# Patient Record
Sex: Female | Born: 1995 | Race: Black or African American | Hispanic: No | Marital: Single | Smoking: Never smoker
Health system: Southern US, Community
[De-identification: ages and names within clinical notes are randomized; demographics above are authoritative.]

## PROBLEM LIST (undated history)

## (undated) DIAGNOSIS — R569 Unspecified convulsions: Secondary | ICD-10-CM

## (undated) DIAGNOSIS — N760 Acute vaginitis: Secondary | ICD-10-CM

## (undated) DIAGNOSIS — R0602 Shortness of breath: Secondary | ICD-10-CM

## (undated) DIAGNOSIS — D649 Anemia, unspecified: Secondary | ICD-10-CM

## (undated) DIAGNOSIS — N83209 Unspecified ovarian cyst, unspecified side: Secondary | ICD-10-CM

## (undated) DIAGNOSIS — I10 Essential (primary) hypertension: Secondary | ICD-10-CM

## (undated) DIAGNOSIS — J45909 Unspecified asthma, uncomplicated: Secondary | ICD-10-CM

## (undated) DIAGNOSIS — R51 Headache: Secondary | ICD-10-CM

## (undated) DIAGNOSIS — F419 Anxiety disorder, unspecified: Secondary | ICD-10-CM

## (undated) DIAGNOSIS — Z3490 Encounter for supervision of normal pregnancy, unspecified, unspecified trimester: Secondary | ICD-10-CM

## (undated) DIAGNOSIS — B999 Unspecified infectious disease: Secondary | ICD-10-CM

## (undated) HISTORY — DX: Essential (primary) hypertension: I10

## (undated) HISTORY — DX: Encounter for supervision of normal pregnancy, unspecified, unspecified trimester: Z34.90

---
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
## 2014-01-20 ENCOUNTER — Inpatient Hospital Stay (HOSPITAL_COMMUNITY)
Admission: AD | Admit: 2014-01-20 | Discharge: 2014-01-20 | Disposition: A | Discharge status: Home / Self Care | Payer: BC Managed Care – PPO | Source: Ambulatory Visit | Attending: Obstetrics & Gynecology | Admitting: Obstetrics & Gynecology

## 2014-01-20 ENCOUNTER — Inpatient Hospital Stay (HOSPITAL_COMMUNITY): Payer: BC Managed Care – PPO

## 2014-01-20 ENCOUNTER — Encounter (HOSPITAL_COMMUNITY): Payer: Self-pay | Admitting: *Deleted

## 2014-01-20 DIAGNOSIS — N760 Acute vaginitis: Secondary | ICD-10-CM

## 2014-01-20 DIAGNOSIS — F17210 Nicotine dependence, cigarettes, uncomplicated: Secondary | ICD-10-CM | POA: Diagnosis not present

## 2014-01-20 DIAGNOSIS — R1032 Left lower quadrant pain: Secondary | ICD-10-CM | POA: Insufficient documentation

## 2014-01-20 DIAGNOSIS — B9689 Other specified bacterial agents as the cause of diseases classified elsewhere: Secondary | ICD-10-CM

## 2014-01-20 DIAGNOSIS — A499 Bacterial infection, unspecified: Secondary | ICD-10-CM

## 2014-01-20 LAB — URINALYSIS, ROUTINE W REFLEX MICROSCOPIC
Bilirubin Urine: NEGATIVE
GLUCOSE, UA: NEGATIVE mg/dL
KETONES UR: NEGATIVE mg/dL
Nitrite: NEGATIVE
PH: 6 (ref 5.0–8.0)
Protein, ur: NEGATIVE mg/dL
Specific Gravity, Urine: 1.02 (ref 1.005–1.030)
Urobilinogen, UA: 1 mg/dL (ref 0.0–1.0)

## 2014-01-20 LAB — URINE MICROSCOPIC-ADD ON

## 2014-01-20 LAB — POCT PREGNANCY, URINE: Preg Test, Ur: NEGATIVE

## 2014-01-20 LAB — WET PREP, GENITAL
Trich, Wet Prep: NONE SEEN
YEAST WET PREP: NONE SEEN

## 2014-01-20 MED ORDER — CLINDAMYCIN HCL 300 MG PO CAPS: 300.0000 mg | ORAL_CAPSULE | Freq: Three times a day (TID) | ORAL | Status: DC

## 2014-01-20 NOTE — MAU Provider Note (Signed)
CC: No chief complaint on file.    First Provider Initiated Contact with Patient 01/20/14 1943      HPI Theresa Marshall is a 18 y.o. G0P0 who presents with onset about a week ago of left lower quadrant abdominal pain. Pain similar to that she had with ovarian cyst in 2014. Vaginal discharge is non-irritative but thicker than usual. Denies dysuria, urgency, frequency. No constipation or diarrhea. Has not taken anything for pain.  LMP 01/11/14. Denies abnormal bleeding. Declines STI testing. Stopped contraceptives recently and undecided on type contraception she wants.  Past Medical History  Diagnosis Date  . Vaginosis   . Seizures     x1, after taking flagyl  . Shortness of breath   . Headache(784.0)   . Infection     UTI, yeast    OB History  Gravida Para Term Preterm AB SAB TAB Ectopic Multiple Living  0                 Past Surgical History  Procedure Laterality Date  . Appendectomy  2008    History   Social History  . Marital Status: Single    Spouse Name: N/A    Number of Children: 0  . Years of Education: college   Occupational History  . Not on file.   Social History Main Topics  . Smoking status: Current Some Day Smoker    Types: Cigarettes  . Smokeless tobacco: Never Used  . Alcohol Use: Yes     Comment: last Saturday  . Drug Use: Yes    Special: Marijuana     Comment: last April  . Sexual Activity: Yes    Birth Control/ Protection: None     Comment: last intercourse September 6th 2015   Other Topics Concern  . Not on file   Social History Narrative   Patient is single, does not have any children   Patient is right handed   Education currently a freshman in college   Caffeine consumption is 1 a week    No current facility-administered medications on file prior to encounter.   No current outpatient prescriptions on file prior to encounter.    Allergies  Allergen Reactions  . Flagyl [Metronidazole] Other (See Comments)    seizures     ROS Pertinent items in HPI  PHYSICAL EXAM There were no vitals filed for this visit. General: Well nourished, well developed female in no acute distress Cardiovascular: Normal rate Respiratory: Normal effort Abdomen: Soft, tender left suprapubic to groin region, otherwise NT without guarding or rebound Back: No CVAT Extremities: No edema Neurologic: Alert and oriented Speculum exam: NEFG; vagina with thick white discharge, scant brown blood; cervix clean Bimanual exam: cervix closed, no CMT; uterus NSSP; no right adnexal tenderness or masses; left adnexal thickened with tenderness   LAB RESULTS Results for orders placed during the hospital encounter of 01/20/14 (from the past 24 hour(s))  WET PREP, GENITAL     Status: Abnormal   Collection Time    01/20/14  7:45 PM      Result Value Ref Range   Yeast Wet Prep HPF POC NONE SEEN  NONE SEEN   Trich, Wet Prep NONE SEEN  NONE SEEN   Clue Cells Wet Prep HPF POC MANY (*) NONE SEEN   WBC, Wet Prep HPF POC FEW (*) NONE SEEN    IMAGING US Transvaginal Non-ob  01/20/2014   CLINICAL DATA:  Left-sided pelvic pain.  History of ovarian cyst.  EXAM: TRANSABDOMINAL  AND TRANSVAGINAL ULTRASOUND OF PELVIS  TECHNIQUE: Both transabdominal and transvaginal ultrasound examinations of the pelvis were performed. Transabdominal technique was performed for global imaging of the pelvis including uterus, ovaries, adnexal regions, and pelvic cul-de-sac. It was necessary to proceed with endovaginal exam following the transabdominal exam to visualize the left ovary and endometrium.  COMPARISON:  10/12/2012.  FINDINGS: Uterus  Measurements: 6.3 x 4.0 x 3.0 cm. No fibroids or other mass visualized.  Endometrium  Thickness: 5.1 mm.  No focal abnormality visualized.  Right ovary  Measurements: 1.7 x 1.2 x 1.0 cm. Normal appearance/no adnexal mass.  Left ovary  Measurements: 3.8 x 2.5 x 1.9 cm. Normal appearance/no adnexal mass. The previously seen left ovarian cyst  is no longer demonstrated.  Other findings  No free fluid.  IMPRESSION: Normal examination.   Electronically Signed   By: Gordan PaymentSteve  Reid M.D.   On: 01/20/2014 22:09   [place] Pelvis Complete  01/20/2014   CLINICAL DATA:  Left-sided pelvic pain.  History of ovarian cyst.  EXAM: TRANSABDOMINAL AND TRANSVAGINAL ULTRASOUND OF PELVIS  TECHNIQUE: Both transabdominal and transvaginal ultrasound examinations of the pelvis were performed. Transabdominal technique was performed for global imaging of the pelvis including uterus, ovaries, adnexal regions, and pelvic cul-de-sac. It was necessary to proceed with endovaginal exam following the transabdominal exam to visualize the left ovary and endometrium.  COMPARISON:  10/12/2012.  FINDINGS: Uterus  Measurements: 6.3 x 4.0 x 3.0 cm. No fibroids or other mass visualized.  Endometrium  Thickness: 5.1 mm.  No focal abnormality visualized.  Right ovary  Measurements: 1.7 x 1.2 x 1.0 cm. Normal appearance/no adnexal mass.  Left ovary  Measurements: 3.8 x 2.5 x 1.9 cm. Normal appearance/no adnexal mass. The previously seen left ovarian cyst is no longer demonstrated.  Other findings  No free fluid.  IMPRESSION: Normal examination.   Electronically Signed   By: Gordan PaymentSteve  Reid M.D.   On: 01/20/2014 22:09    MAU COURSE 2100: [facility] Left[facility] CNM  ASSESSMENT  1. BV (bacterial vaginosis)     PLAN D/C home Clindamycin 300 mg BID x 7 days.  Pt has Flagyl allergy, desires oral medication. [facility] Return to MAU as needed for emergencies  Sharen CounterLisa Leftwich-Kirby Certified Nurse-Midwife

## 2014-01-20 NOTE — MAU Note (Signed)
Pt has c/o pain on left lower side for the past 2-3 weeks.  Pt has not taken anything for pain and believes it is another ovarian cyst.

## 2014-01-20 NOTE — Discharge Instructions (Signed)
Contraception Choices Contraception (birth control) is the use of any methods or devices to prevent pregnancy. Below are some methods to help avoid pregnancy. HORMONAL METHODS   Contraceptive implant. This is a thin, plastic tube containing progesterone hormone. It does not contain estrogen hormone. Your health care provider inserts the tube in the inner part of the upper arm. The tube can remain in place for up to 3 years. After 3 years, the implant must be removed. The implant prevents the ovaries from releasing an egg (ovulation), thickens the cervical mucus to prevent sperm from entering the uterus, and thins the lining of the inside of the uterus.  Progesterone-only injections. These injections are given every 3 months by your health care provider to prevent pregnancy. This synthetic progesterone hormone stops the ovaries from releasing eggs. It also thickens cervical mucus and changes the uterine lining. This makes it harder for sperm to survive in the uterus.  Birth control pills. These pills contain estrogen and progesterone hormone. They work by preventing the ovaries from releasing eggs (ovulation). They also cause the cervical mucus to thicken, preventing the sperm from entering the uterus. Birth control pills are prescribed by a health care provider.Birth control pills can also be used to treat heavy periods.  Minipill. This type of birth control pill contains only the progesterone hormone. They are taken every day of each month and must be prescribed by your health care provider.  Birth control patch. The patch contains hormones similar to those in birth control pills. It must be changed once a week and is prescribed by a health care provider.  Vaginal ring. The ring contains hormones similar to those in birth control pills. It is left in the vagina for 3 weeks, removed for 1 week, and then a new one is put back in place. The patient must be comfortable inserting and removing the ring  from the vagina.A health care provider's prescription is necessary.  Emergency contraception. Emergency contraceptives prevent pregnancy after unprotected sexual intercourse. This pill can be taken right after sex or up to 5 days after unprotected sex. It is most effective the sooner you take the pills after having sexual intercourse. Most emergency contraceptive pills are available without a prescription. Check with your pharmacist. Do not use emergency contraception as your only form of birth control. BARRIER METHODS   Female condom. This is a thin sheath (latex or rubber) that is worn over the penis during sexual intercourse. It can be used with spermicide to increase effectiveness.  Female condom. This is a soft, loose-fitting sheath that is put into the vagina before sexual intercourse.  Diaphragm. This is a soft, latex, dome-shaped barrier that must be fitted by a health care provider. It is inserted into the vagina, along with a spermicidal jelly. It is inserted before intercourse. The diaphragm should be left in the vagina for 6 to 8 hours after intercourse.  Cervical cap. This is a round, soft, latex or plastic cup that fits over the cervix and must be fitted by a health care provider. The cap can be left in place for up to 48 hours after intercourse.  Sponge. This is a soft, circular piece of polyurethane foam. The sponge has spermicide in it. It is inserted into the vagina after wetting it and before sexual intercourse.  Spermicides. These are chemicals that kill or block sperm from entering the cervix and uterus. They come in the form of creams, jellies, suppositories, foam, or tablets. They do not require a  prescription. They are inserted into the vagina with an applicator before having sexual intercourse. The process must be repeated every time you have sexual intercourse. INTRAUTERINE CONTRACEPTION  Intrauterine device (IUD). This is a T-shaped device that is put in a woman's uterus  during a menstrual period to prevent pregnancy. There are 2 types:  Copper IUD. This type of IUD is wrapped in copper wire and is placed inside the uterus. Copper makes the uterus and fallopian tubes produce a fluid that kills sperm. It can stay in place for 10 years.  Hormone IUD. This type of IUD contains the hormone progestin (synthetic progesterone). The hormone thickens the cervical mucus and prevents sperm from entering the uterus, and it also thins the uterine lining to prevent implantation of a fertilized egg. The hormone can weaken or kill the sperm that get into the uterus. It can stay in place for 3-5 years, depending on which type of IUD is used. PERMANENT METHODS OF CONTRACEPTION  Female tubal ligation. This is when the woman's fallopian tubes are surgically sealed, tied, or blocked to prevent the egg from traveling to the uterus.  Hysteroscopic sterilization. This involves placing a small coil or insert into each fallopian tube. Your doctor uses a technique called hysteroscopy to do the procedure. The device causes scar tissue to form. This results in permanent blockage of the fallopian tubes, so the sperm cannot fertilize the egg. It takes about 3 months after the procedure for the tubes to become blocked. You must use another form of birth control for these 3 months.  Female sterilization. This is when the female has the tubes that carry sperm tied off (vasectomy).This blocks sperm from entering the vagina during sexual intercourse. After the procedure, the man can still ejaculate fluid (semen). NATURAL PLANNING METHODS  Natural family planning. This is not having sexual intercourse or using a barrier method (condom, diaphragm, cervical cap) on days the woman could become pregnant.  Calendar method. This is keeping track of the length of each menstrual cycle and identifying when you are fertile.  Ovulation method. This is avoiding sexual intercourse during ovulation.  Symptothermal  method. This is avoiding sexual intercourse during ovulation, using a thermometer and ovulation symptoms.  Post-ovulation method. This is timing sexual intercourse after you have ovulated. Regardless of which type or method of contraception you choose, it is important that you use condoms to protect against the transmission of sexually transmitted infections (STIs). Talk with your health care provider about which form of contraception is most appropriate for you. Document Released: 04/04/2005 Document Revised: 04/09/2013 Document Reviewed: 09/27/2012 [facility] Patient Information 2015 [place] [place]. This information is not intended to replace advice given to you by your health care provider. Make sure you discuss any questions you have with your health care provider. Bacterial Vaginosis Bacterial vaginosis is a vaginal infection that occurs when the normal balance of bacteria in the vagina is disrupted. It results from an overgrowth of certain bacteria. This is the most common vaginal infection in women of childbearing age. Treatment is important to prevent complications, especially in pregnant women, as it can cause a premature delivery. CAUSES  Bacterial vaginosis is caused by an increase in harmful bacteria that are normally present in smaller amounts in the vagina. Several different kinds of bacteria can cause bacterial vaginosis. However, the reason that the condition develops is not fully understood. RISK FACTORS Certain activities or behaviors can put you at an increased risk of developing bacterial vaginosis, including:  Having a  new sex partner or multiple sex partners.  Douching.  Using an intrauterine device (IUD) for contraception. Women do not get bacterial vaginosis from toilet seats, bedding, swimming pools, or contact with objects around them. SIGNS AND SYMPTOMS  Some women with bacterial vaginosis have no signs or symptoms. Common symptoms include:  Grey vaginal  discharge.  A fishlike odor with discharge, especially after sexual intercourse.  Itching or burning of the vagina and vulva.  Burning or pain with urination. DIAGNOSIS  Your health care provider will take a medical history and examine the vagina for signs of bacterial vaginosis. A sample of vaginal fluid may be taken. Your health care provider will look at this sample under a microscope to check for bacteria and abnormal cells. A vaginal pH test may also be done.  TREATMENT  Bacterial vaginosis may be treated with antibiotic medicines. These may be given in the form of a pill or a vaginal cream. A second round of antibiotics may be prescribed if the condition comes back after treatment.  HOME CARE INSTRUCTIONS   Only take over-the-counter or prescription medicines as directed by your health care provider.  If antibiotic medicine was prescribed, take it as directed. Make sure you finish it even if you start to feel better.  Do not have sex until treatment is completed.  Tell all sexual partners that you have a vaginal infection. They should see their health care provider and be treated if they have problems, such as a mild rash or itching.  Practice safe sex by using condoms and only having one sex partner. SEEK MEDICAL CARE IF:   Your symptoms are not improving after 3 days of treatment.  You have increased discharge or pain.  You have a fever. MAKE SURE YOU:   Understand these instructions.  Will watch your condition.  Will get help right away if you are not doing well or get worse. FOR MORE INFORMATION  Centers for Disease Control and Prevention, Division of STD Prevention: [employer]s.[employer] American Sexual Health Association (ASHA): www.ashastd.org  Document Released: 04/04/2005 Document Revised: 01/23/2013 Document Reviewed: 11/14/2012 [facility] Patient Information 2015 [place], [place]. This information is not intended to replace advice given to you by your health care  provider. Make sure you discuss any questions you have with your health care provider.

## 2014-01-23 NOTE — MAU Provider Note (Signed)
.  khklm

## 2014-01-29 ENCOUNTER — Emergency Department (HOSPITAL_COMMUNITY)
Admission: EM | Admit: 2014-01-29 | Discharge: 2014-01-29 | Disposition: A | Discharge status: Home / Self Care | Payer: BC Managed Care – PPO | Attending: Emergency Medicine | Admitting: Emergency Medicine

## 2014-01-29 ENCOUNTER — Encounter (HOSPITAL_COMMUNITY): Payer: Self-pay | Admitting: Emergency Medicine

## 2014-01-29 DIAGNOSIS — N390 Urinary tract infection, site not specified: Secondary | ICD-10-CM | POA: Insufficient documentation

## 2014-01-29 DIAGNOSIS — N898 Other specified noninflammatory disorders of vagina: Secondary | ICD-10-CM | POA: Diagnosis not present

## 2014-01-29 DIAGNOSIS — G40909 Epilepsy, unspecified, not intractable, without status epilepticus: Secondary | ICD-10-CM | POA: Insufficient documentation

## 2014-01-29 DIAGNOSIS — Z792 Long term (current) use of antibiotics: Secondary | ICD-10-CM | POA: Insufficient documentation

## 2014-01-29 DIAGNOSIS — R569 Unspecified convulsions: Secondary | ICD-10-CM

## 2014-01-29 DIAGNOSIS — Z87891 Personal history of nicotine dependence: Secondary | ICD-10-CM | POA: Insufficient documentation

## 2014-01-29 DIAGNOSIS — Z3202 Encounter for pregnancy test, result negative: Secondary | ICD-10-CM | POA: Insufficient documentation

## 2014-01-29 DIAGNOSIS — R109 Unspecified abdominal pain: Secondary | ICD-10-CM | POA: Diagnosis present

## 2014-01-29 LAB — URINALYSIS, ROUTINE W REFLEX MICROSCOPIC
BILIRUBIN URINE: NEGATIVE
Glucose, UA: NEGATIVE mg/dL
Ketones, ur: NEGATIVE mg/dL
Nitrite: NEGATIVE
PROTEIN: NEGATIVE mg/dL
Specific Gravity, Urine: 1.004 — ABNORMAL LOW (ref 1.005–1.030)
UROBILINOGEN UA: 0.2 mg/dL (ref 0.0–1.0)
pH: 6.5 (ref 5.0–8.0)

## 2014-01-29 LAB — URINE MICROSCOPIC-ADD ON

## 2014-01-29 LAB — WET PREP, GENITAL
Clue Cells Wet Prep HPF POC: NONE SEEN
Trich, Wet Prep: NONE SEEN
WBC, Wet Prep HPF POC: NONE SEEN
Yeast Wet Prep HPF POC: NONE SEEN

## 2014-01-29 LAB — POC URINE PREG, ED: Preg Test, Ur: NEGATIVE

## 2014-01-29 MED ORDER — CEPHALEXIN 500 MG PO CAPS: 500.0000 mg | ORAL_CAPSULE | Freq: Three times a day (TID) | ORAL | Status: DC

## 2014-01-29 MED ORDER — HYDROCODONE-ACETAMINOPHEN 5-325 MG PO TABS: 1.0000 | ORAL_TABLET | Freq: Four times a day (QID) | ORAL | Status: DC | PRN

## 2014-01-29 MED ORDER — ONDANSETRON HCL 4 MG PO TABS: 4.0000 mg | ORAL_TABLET | Freq: Four times a day (QID) | ORAL | Status: DC

## 2014-01-29 MED ORDER — ONDANSETRON 4 MG PO TBDP
4.0000 mg | ORAL_TABLET | Freq: Once | ORAL | Status: AC
  Administered 2014-01-29: 4 mg via ORAL
  Filled 2014-01-29: qty 1

## 2014-01-29 MED ORDER — OXYCODONE-ACETAMINOPHEN 5-325 MG PO TABS
1.0000 | ORAL_TABLET | Freq: Once | ORAL | Status: AC
  Administered 2014-01-29: 1 via ORAL
  Filled 2014-01-29: qty 1

## 2014-01-29 NOTE — ED Provider Notes (Signed)
Medical screening examination/treatment/procedure(s) were performed by non-physician practitioner and as supervising physician I was immediately available for consultation/collaboration.    Linwood DibblesJon Sita Mangen, MD 01/29/14 [phone]

## 2014-01-29 NOTE — ED Notes (Signed)
MD at bedside. PA Courtney at bedside.

## 2014-01-29 NOTE — ED Notes (Signed)
MD at bedside. 

## 2014-01-29 NOTE — Discharge Instructions (Signed)
Urinary Tract Infection °Urinary tract infections (UTIs) can develop anywhere along your urinary tract. Your urinary tract is your body's drainage system for removing wastes and extra water. Your urinary tract includes two kidneys, two ureters, a bladder, and a urethra. Your kidneys are a pair of bean-shaped organs. Each kidney is about the size of your fist. They are located below your ribs, one on each side of your spine. °CAUSES °Infections are caused by microbes, which are microscopic organisms, including fungi, viruses, and bacteria. These organisms are so small that they can only be seen through a microscope. Bacteria are the microbes that most commonly cause UTIs. °SYMPTOMS  °Symptoms of UTIs may vary by age and gender of the patient and by the location of the infection. Symptoms in young women typically include a frequent and intense urge to urinate and a painful, burning feeling in the bladder or urethra during urination. Older women and men are more likely to be tired, shaky, and weak and have muscle aches and abdominal pain. A fever may mean the infection is in your kidneys. Other symptoms of a kidney infection include pain in your back or sides below the ribs, nausea, and vomiting. °DIAGNOSIS °To diagnose a UTI, your caregiver will ask you about your symptoms. Your caregiver also will ask to provide a urine sample. The urine sample will be tested for bacteria and white blood cells. White blood cells are made by your body to help fight infection. °TREATMENT  °Typically, UTIs can be treated with medication. Because most UTIs are caused by a bacterial infection, they usually can be treated with the use of antibiotics. The choice of antibiotic and length of treatment depend on your symptoms and the type of bacteria causing your infection. °HOME CARE INSTRUCTIONS °· If you were prescribed antibiotics, take them exactly as your caregiver instructs you. Finish the medication even if you feel better after you  have only taken some of the medication. °· Drink enough water and fluids to keep your urine clear or pale yellow. °· Avoid caffeine, tea, and carbonated beverages. They tend to irritate your bladder. °· Empty your bladder often. Avoid holding urine for long periods of time. °· Empty your bladder before and after sexual intercourse. °· After a bowel movement, women should cleanse from front to back. Use each tissue only once. °SEEK MEDICAL CARE IF:  °· You have back pain. °· You develop a fever. °· Your symptoms do not begin to resolve within 3 days. °SEEK IMMEDIATE MEDICAL CARE IF:  °· You have severe back pain or lower abdominal pain. °· You develop chills. °· You have nausea or vomiting. °· You have continued burning or discomfort with urination. °MAKE SURE YOU:  °· Understand these instructions. °· Will watch your condition. °· Will get help right away if you are not doing well or get worse. °Document Released: 01/12/2005 Document Revised: 10/04/2011 Document Reviewed: 05/13/2011 °ExitCare® Patient Information ©2015 [employer]. This information is not intended to replace advice given to you by your health care provider. Make sure you discuss any questions you have with your health care provider. ° ° °Emergency Department Resource Guide °1) Find a Doctor and Pay Out of Pocket °Although you won't have to find out who is covered by your insurance plan, it is a good idea to ask around and get recommendations. You will then need to call the office and see if the doctor you have chosen will accept you as a new patient and what types of   options they offer for patients who are self-pay. Some doctors offer discounts or will set up payment plans for their patients who do not have insurance, but you will need to ask so you aren't surprised when you get to your appointment. ° °2) Contact Your Local Health Department °Not all health departments have doctors that can see patients for sick visits, but many do, so it is worth  a call to see if yours does. If you don't know where your local health department is, you can check in your phone book. The CDC also has a tool to help you locate your state's health department, and many state websites also have listings of all of their local health departments. ° °3) Find a Walk-in Clinic °If your illness is not likely to be very severe or complicated, you may want to try a walk in clinic. These are popping up all over the country in pharmacies, drugstores, and shopping centers. They're usually staffed by nurse practitioners or physician assistants that have been trained to treat common illnesses and complaints. They're usually fairly quick and inexpensive. However, if you have serious medical issues or chronic medical problems, these are probably not your best option. ° °No Primary Care Doctor: °- Call [facility] at  832-8000 - they can help you locate a primary care doctor that  accepts your insurance, provides certain services, etc. °- Physician Referral Service- 1-800-533-3463 ° °Chronic Pain Problems: °Organization         Address  Phone   Notes  °[facility]  (336) 297-2271 Patients need to be referred by their primary care doctor.  ° °Medication Assistance: °Organization         Address  Phone   Notes  °[place] [facility] [address] °[place], [place] [address] (336) 641-8030 --Must be a resident of [place] °-- Must have NO insurance coverage whatsoever (no Medicaid/ Medicare, etc.) °-- The pt. MUST have a primary care doctor that directs their care regularly and follows them in the community °  °[facility]  (866) 331-1348   °[employer]  (888) 892-1162   ° °Agencies that provide inexpensive medical care: °Organization         Address  Phone   Notes  °[facility]  (336) 832-8035   °[facility]    (336) 832-7272   °[facility] [address] °[place], [place]  [address] (336) 832-4777   °[facility] [address], °[place] (336) 271-4999   °[facility]    (336) 373-0678   °[facility]    (336) 272-1050   °[facility] ° [address], [place] Phone:  (336) 832-4444, Fax:  (336) 832-4440 Hours of Operation:  9 am - 6 pm, M-F.  Also accepts Medicaid/Medicare and self-pay.  °[facility] ° [address], [place] Phone: (336) 832-3150, Fax: (336) 832-3151. Hours of Operation:  8:30 am - 5:30 pm, M-F.  Also accepts Medicaid and self-pay.  °[facility] [place] [address], [place] Phone: (336) 878-6027   °[facility] [address], [place], [place] (336)723-1848, Ext. 123 Mondays & Thursdays: 7-9 AM.  First 15 patients are seen on a first come, first serve basis. °  ° °Medicaid-accepting [place] Providers: ° °Organization         Address  Phone   Notes  °  [facility] [address], [place] (336) 641-2100 Also accepts self-pay patients.  °[facility] [address], [place] ° (336) 856-9996   °[facility] [address], [place] (336) 288-8857   °[facility] [address] [place] [address], [place] (336) 299-7000   °Veita Bland [address], [place]  ° (336) 373-1557 Only accepts [place] Access Medicaid patients after they have their name applied to their card.  ° °Self-Pay (no insurance) in [place]: ° °Organization         Address  Phone   Notes  °Sickle Cell Patients, [facility] [address], [place] (336) 832-1970   °[facility] [address], [place] (336) 832-4400   °[facility] ° [address], [place] (336) 992-4800   °[facility]/Dr. Osei-Bonsu ° [address] [place] [address], [place] or [address], [place] (336) 841-8500 Phone number for both [place] and [place] locations is the same.  °[facility] [address], [place] (336) 299-0000   °[facility] [address] [place] [address], [place] or [address] (336) 852-7530 °(336) 878-2260   °[facility] [address], [place] (336) 350-1642, phone; (336) 294-5005, fax Sees patients 1st and 3rd Saturday of every month.  Must not qualify for public or private insurance (i.e. Medicaid, Medicare, [place] Health Choice, Veterans' Benefits) • Household income should be no more than 200% of the poverty level •The clinic cannot treat you if you are pregnant or think you are pregnant • Sexually transmitted diseases are not treated at the clinic.  ° ° °Dental Care: °Organization         Address  Phone  Notes  °[facility] [address], [place] (336) 641-6152 Accepts children up to age 21 who are enrolled in Medicaid or [place] Health Choice; pregnant women with a Medicaid card; and children who have applied for Medicaid or [place] Health Choice, but were declined, whose parents can pay a reduced fee at time of service.  °[facility] [place]  [address], [place] (336) 641-7733 Accepts children up to age 21 who are enrolled in Medicaid or [place] Health Choice; pregnant women with a Medicaid card; and children who have applied for Medicaid or [place] Health Choice, but were declined, whose parents can pay a reduced fee at time of service.  °[facility] ° [address],  (336) 641-4533 Patients are seen by appointment only. Walk-ins are not accepted. [facility] will see patients 18 years of age and older. °Monday - Tuesday (8am-5pm) °Most Wednesdays (8:30-5pm) °$30 per visit, cash only  °[facility] ° [address], [place] (336) 641-4533  Patients are seen by appointment only. Walk-ins are not accepted. [facility] will see patients 18 years of age and older. °One Wednesday Evening (Monthly: Volunteer Based).  $30 per visit, cash only  °[facility]  (919) 537-3737 for adults; Children under age 4, call Graduate Pediatric Dentistry at (919) [phone]. Children aged 4-14, please call (919) [phone] to request a pediatric application. ° Dental services are provided in all areas of dental care including fillings, crowns and bridges, complete and partial   dentures, implants, gum treatment, root canals, and extractions. Preventive care is also provided. Treatment is provided to both adults and children. °Patients are selected via a lottery and there is often a waiting list. °  °[facility] [address], °[place] ° (336) 763-8833 [email or web address] °  °[facility] [address], [place], [place] (336)723-1848, Ext. 123 Second and Fourth Thursday of each month, opens at 6:30 AM; Clinic ends at 9 AM.  Patients are seen on a first-come first-served basis, and a limited number are seen during each clinic.  ° °[facility] ° [address], [place], [place] (336) 723-7904   Eligibility Requirements °You must have lived in [place], [place], or [place] counties for at least the last three months. °  You cannot be eligible for state or federal sponsored healthcare insurance, including Veterans Administration, Medicaid, or Medicare. °  You generally cannot be eligible for healthcare insurance through your employer.  °  How to apply: °Eligibility screenings are held every Tuesday and Wednesday afternoon from 1:00 pm until 4:00 pm. You do not need an appointment for the interview!  °[facility] [address], [place], [place] 336-631-2330   °[facility]  336-342-8273   °[facility]  336-703-3100   °[facility]   336-570-6415   ° °Behavioral Health Resources in the Community: °Intensive Outpatient Programs °Organization         Address  Phone  Notes  °[facility] [address], [place], [place] 336-878-6098   °[facility] [address], [place], [place] 336-832-9800   °[facility] [address], [place], [place] ° 336-882-2125   °[facility] [address],  °[place], [place] 1-800-853-5163 or 336-641-4981   °Substance Abuse Resources °Organization         Address  Phone  Notes  °[facility]  336-882-2125   °[facility]  336-784-9470   °[facility]  336-285-9073   °[facility]  336-845-3988   °Residential & Outpatient Substance Abuse Program  1-800-659-3381   °Psychological Services °Organization         Address  Phone  Notes  °[facility]  336- 832-9600   °[facility]  336- 378-7881   °[facility] [address], [place] 1-800-853-5163 or 336-641-4981   ° °Mobile Crisis Teams °Organization         Address  Phone  Notes  °[facility], Mobile Crisis Care Unit  1-877-626-1772   °Assertive °[facility] ° [address] [place], [place] 336-834-9664   °Sharon DeEsch [address] °[place] 336-554-5454   ° °Self-Help/Support Groups °Organization         Address  Phone             Notes  °[facility] [place] - variety of support groups  336- 373-1402 Call for more information  °Narcotics Anonymous (NA), [facility] [address], °[place]  2 meetings at this location  ° °Residential Treatment Programs °Organization         Address  Phone  Notes  °[facility] [address],    °[place]  1-866-801-8205   °[facility] ° [address], [place],  704-293-8524   °[facility] [address], [place] 336-845-3988 Admissions: 8am-3pm M-F   °  [facility] [address]    °[place], [place] 336-841-1104   °[facility] [address], [place], [place] 336-379-7146   °[facility] [address]  °[place], [place] 336-285-9073   °[facility] Intensive Outpatient [address], [place], [place] 336-852-3033   °[facility] [address]  °[place], [place] 1-877-615-2722 or 336-784-9470   °[facility] [address] [place], [place] 336-227-7417 Accepts Medicaid  °[facility] [address]  °[place] 1-800-659-3381 Substance Abuse/Addiction Treatment  ° °[place] Behavioral Health Resources °Organization         Address  Phone  Notes  °[facility]  (888) 581-9988   °Julie Brannon, PhD [address] [place], [place]   (336) 349-5553 or (336) 951-0000   °[facility]   [address] °[place], [place] (336) 349-4454   °[facility] [address], [place], [place] (336) 342-8316 Insurance/Medicaid/sponsorship through [facility]  °[facility] [address]                                    [place], [place] (336) 342-8316 Therapy/tele-psych/case  °[facility] [address]  ° [place], [place] (336) 349-2233    °Dr. Arfeen  (336) 349-4544   °[facility] [place]  [employer] [place] [facility] 1) [address], [place] °2) [address], [place] °3)  [address], [place] (336) 349-3220 °(336) 342-7768 ° °(336) 342-8140   °[place] [facility] (336) 342-1394 or (336) 342-3537 (After Hours)    ° ° ° ° °

## 2014-01-29 NOTE — ED Provider Notes (Addendum)
CSN: [id number]     Arrival date & time 01/29/14  1343 History   First MD Initiated Contact with Patient 01/29/14 1511     Chief Complaint  Patient presents with  . Seizures  . Flank Pain  . Dysuria   Patient is a 18 y.o. female presenting with seizures, flank pain, and dysuria.  Seizures Flank Pain Associated symptoms include abdominal pain, chills, nausea and vomiting. Pertinent negatives include no fatigue, fever, headaches, myalgias or numbness.  Dysuria Associated symptoms: abdominal pain, flank pain, nausea and vomiting   Associated symptoms: no fever and no vaginal discharge     Patient is a 18 y.o. Female who presents to the ED with right flank pain, dysuria, and seizure.  Patient states that last night she began experiencing increased urinary frequency, dysuria, and urinary pressure associated with right flank pain.  Patient states that the flank pain is constant and sharp and aching.  Patient states that she has not really tried anything at home.  She states that she has a history of frequent UTI history but no history of renal stones.  Patient states that she is currently undergoing treatment of BV with clindamycin.  Patient had a seizure today at approximately 1:30 pm.  Per a witness the seizure lasted for approximately 10 minutes and was jerking in nature with no loss of bowel or bladder.  Patient has a history of seizures and was previously on Keppra.  She thinks that her seizures were triggered by anxiety.  [facility]  Patient has not had a seizure in a long time.    Past Medical History  Diagnosis Date  . Vaginosis   . Seizures     x1, after taking flagyl  . Shortness of breath   . Headache(784.0)   . Infection     UTI, yeast   Past Surgical History  Procedure Laterality Date  . Appendectomy  2008   No family history on file. History  Substance Use Topics  . Smoking status: Former Smoker    Types: Cigarettes  . Smokeless tobacco:  Never Used  . Alcohol Use: Yes     Comment: last Saturday   OB History   Grav Para Term Preterm Abortions TAB SAB Ect Mult Living   0              Review of Systems  Constitutional: Positive for chills. Negative for fever and fatigue.  Respiratory: Negative for chest tightness and shortness of breath.   Gastrointestinal: Positive for nausea, vomiting and abdominal pain. Negative for diarrhea, constipation and blood in stool.  Genitourinary: Positive for dysuria, urgency, frequency, hematuria and flank pain. Negative for vaginal bleeding, vaginal discharge, difficulty urinating and vaginal pain.  Musculoskeletal: Negative for back pain, myalgias and neck stiffness.  Neurological: Positive for seizures. Negative for dizziness, numbness and headaches.  All other systems reviewed and are negative.     Allergies  Flagyl  Home Medications   Prior to Admission medications   Medication Sig Start Date End Date Taking? Authorizing Provider  ALPRAZolam (XANAX) 0.25 MG tablet Take 0.25 mg by mouth 2 (two) times daily as needed for anxiety.   Yes Historical Provider, MD  cephALEXin (KEFLEX) 500 MG capsule Take 1 capsule (500 mg total) by mouth 3 (three) times daily. 01/29/14   Moritz Lever A Forcucci, PA-C  clindamycin (CLEOCIN) 300 MG capsule Take 1 capsule (300 mg total) by mouth 3 (three) times daily. 01/20/14   Hurshel PartyLisa A Leftwich-Kirby, CNM  HYDROcodone-acetaminophen (NORCO/VICODIN) 5-325 MG per tablet Take 1 tablet by mouth every 6 (six) hours as needed for moderate pain or severe pain. 01/29/14   Nur Krasinski A Forcucci, PA-C  ondansetron (ZOFRAN) 4 MG tablet Take 1 tablet (4 mg total) by mouth every 6 (six) hours. 01/29/14   Saarah Dewing A Forcucci, PA-C   BP 117/65  Pulse 82  Temp(Src) 98.1 F (36.7 C) (Oral)  Resp 18  SpO2 100%  LMP 01/11/2014 Physical Exam  Nursing note and vitals reviewed. Constitutional: She is oriented to person, place, and time. She appears well-developed and  well-nourished. No distress.  HENT:  Head: Normocephalic and atraumatic.  Mouth/Throat: Oropharynx is clear and moist. No oropharyngeal exudate.  Eyes: Conjunctivae and EOM are normal. Pupils are equal, round, and reactive to light. No scleral icterus.  Neck: Normal range of motion. Neck supple. No JVD present. No thyromegaly present.  Cardiovascular: Normal rate, regular rhythm and intact distal pulses.  Exam reveals no gallop and no friction rub.   No murmur heard. Pulmonary/Chest: Effort normal and breath sounds normal. No respiratory distress. She has no wheezes. She has no rales. She exhibits no tenderness.  Abdominal: Soft. Normal appearance and bowel sounds are normal. She exhibits no distension and no mass. There is tenderness in the right lower quadrant. There is CVA tenderness (mild right CVA tenderness). There is no rigidity, no rebound, no guarding, no tenderness at McBurney's point and negative Murphy's sign.  Genitourinary: No labial fusion. There is no rash, tenderness, lesion or injury on the right labia. There is no rash, tenderness, lesion or injury on the left labia. Cervix exhibits discharge. Cervix exhibits no motion tenderness and no friability. Right adnexum displays no mass, no tenderness and no fullness. Left adnexum displays no mass, no tenderness and no fullness. No erythema, tenderness or bleeding around the vagina. No foreign body around the vagina. No signs of injury around the vagina. Vaginal discharge found.  White thin vaginal discharge  Musculoskeletal: Normal range of motion.  Lymphadenopathy:    She has no cervical adenopathy.  Neurological: She is alert and oriented to person, place, and time. She has normal strength. No cranial nerve deficit or sensory deficit. Coordination and gait normal.  Skin: Skin is warm and dry. She is not diaphoretic.  Psychiatric: She has a normal mood and affect. Her behavior is normal. Judgment and thought content normal.    ED  Course  Procedures (including critical care time) Labs Review Labs Reviewed  URINALYSIS, ROUTINE W REFLEX MICROSCOPIC - Abnormal; Notable for the following:    APPearance CLOUDY (*)    Specific Gravity, Urine 1.004 (*)    Hgb urine dipstick LARGE (*)    Leukocytes, UA LARGE (*)    All other components within normal limits  URINE MICROSCOPIC-ADD ON - Abnormal; Notable for the following:    Squamous Epithelial / LPF FEW (*)    All other components within normal limits  WET PREP, GENITAL  GC/CHLAMYDIA PROBE AMP  URINE CULTURE  POC URINE PREG, ED    Imaging Review No results found.   EKG Interpretation None      MDM   Final diagnoses:  Seizure  UTI (lower urinary tract infection)   Patient is a 18 y.o. Female with PMH of seizures who presents to the ED with seizure and urinary symptoms.  Physical exam reveals alert and non-toxic appear female with mild right CVA tenderness, suprapubic tenderness and RLQ tenderness.  Patient had previous appendectomy doubt appendicitis.  There  are no focal neuro deficits on examination.  UA reveals hematuria and large leukocytes.  Urine pregnancy is negative.  GC is pending.  Wet prep is unremarkable.  Urine culture is pending.  Suspect UTI which may have triggered seizures.  Will treat with keflex 500 mg TID and will have the patient follow-up with [facility] neurology.  Patient to return to the ED with intractable fever, nausea, vomiting, or pain or difficulty with urinating.  Patient states understanding and agreement to the above plan.  Will discharge the patient home with hydrocodone #6, zofran, and keflex.  Will defer to neuro for restarting seizure medications. Patient was discussed with Dr. Lynelle Doctor who agrees with the above plan and discharge.      Eben Burow, PA-C 01/29/14 1737  Eben Burow, PA-C 01/29/14 [phone]

## 2014-01-29 NOTE — ED Notes (Signed)
Pt ambulatory upon dc/ She reports she is leaving with ALL belongings she arrived with. 

## 2014-01-29 NOTE — ED Provider Notes (Signed)
Medical screening examination/treatment/procedure(s) were performed by non-physician practitioner and as supervising physician I was immediately available for consultation/collaboration.    Linwood DibblesJon Iara Monds, MD 01/29/14 [phone]

## 2014-01-29 NOTE — ED Notes (Signed)
Per EMS pt coming from home with primary c/o right flank pain and difficulty urinating as well as of possible seizure. 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

## 2014-01-30 LAB — GC/CHLAMYDIA PROBE AMP
CT Probe RNA: NEGATIVE
GC PROBE AMP APTIMA: NEGATIVE

## 2014-02-01 LAB — URINE CULTURE
Colony Count: >=100000
SPECIAL REQUESTS: NORMAL

## 2014-02-03 ENCOUNTER — Telehealth (HOSPITAL_BASED_OUTPATIENT_CLINIC_OR_DEPARTMENT_OTHER): Payer: Self-pay

## 2014-02-03 NOTE — Telephone Encounter (Signed)
Post ED Visit - Positive Culture Follow-up  Culture report reviewed by antimicrobial stewardship pharmacist: []  Wes Dulaney, Pharm.D., BCPS [x]  Celedonio MiyamotoJeremy Frens, [address] BCPS []  Georgina PillionElizabeth Martin, [address] BCPS []  ParcMinh Pham, [address] BCPS, AAHIVP []  Estella HuskMichelle Turner, Pharm.D., BCPS, AAHIVP []  Carly Sabat, Pharm.D. []  Enzo BiNathan Batchelder, [address]  Positive Urine culture Treated with Cephalexin, organism sensitive to the same and no further patient follow-up is required at this time.  Arvid RightClark, Sami Froh Dorn 02/03/2014, 4:37 AM

## 2014-03-25 ENCOUNTER — Encounter (HOSPITAL_BASED_OUTPATIENT_CLINIC_OR_DEPARTMENT_OTHER): Payer: Self-pay | Admitting: Emergency Medicine

## 2014-03-25 ENCOUNTER — Emergency Department (HOSPITAL_BASED_OUTPATIENT_CLINIC_OR_DEPARTMENT_OTHER): Payer: BC Managed Care – PPO

## 2014-03-25 ENCOUNTER — Emergency Department (HOSPITAL_BASED_OUTPATIENT_CLINIC_OR_DEPARTMENT_OTHER)
Admission: EM | Admit: 2014-03-25 | Discharge: 2014-03-25 | Disposition: A | Discharge status: Home / Self Care | Payer: BC Managed Care – PPO | Attending: Emergency Medicine | Admitting: Emergency Medicine

## 2014-03-25 DIAGNOSIS — R569 Unspecified convulsions: Secondary | ICD-10-CM

## 2014-03-25 DIAGNOSIS — Z87891 Personal history of nicotine dependence: Secondary | ICD-10-CM | POA: Insufficient documentation

## 2014-03-25 DIAGNOSIS — G40909 Epilepsy, unspecified, not intractable, without status epilepticus: Secondary | ICD-10-CM | POA: Diagnosis not present

## 2014-03-25 DIAGNOSIS — Z87440 Personal history of urinary (tract) infections: Secondary | ICD-10-CM | POA: Diagnosis not present

## 2014-03-25 DIAGNOSIS — R079 Chest pain, unspecified: Secondary | ICD-10-CM | POA: Insufficient documentation

## 2014-03-25 DIAGNOSIS — Z8742 Personal history of other diseases of the female genital tract: Secondary | ICD-10-CM | POA: Diagnosis not present

## 2014-03-25 DIAGNOSIS — Z792 Long term (current) use of antibiotics: Secondary | ICD-10-CM | POA: Insufficient documentation

## 2014-03-25 DIAGNOSIS — Z79899 Other long term (current) drug therapy: Secondary | ICD-10-CM | POA: Diagnosis not present

## 2014-03-25 LAB — BASIC METABOLIC PANEL
ANION GAP: 14 (ref 5–15)
BUN: 11 mg/dL (ref 6–23)
CALCIUM: 9.3 mg/dL (ref 8.4–10.5)
CO2: 25 mEq/L (ref 19–32)
Chloride: 104 mEq/L (ref 96–112)
Creatinine, Ser: 0.8 mg/dL (ref 0.50–1.10)
GFR calc Af Amer: >90 mL/min (ref >90)
Glucose, Bld: 112 mg/dL — ABNORMAL HIGH (ref 70–99)
Potassium: 3.7 mEq/L (ref 3.7–5.3)
Sodium: 143 mEq/L (ref 137–147)

## 2014-03-25 LAB — CBC WITH DIFFERENTIAL/PLATELET
BASOS ABS: 0 10*3/uL (ref 0.0–0.1)
Basophils Relative: 0 % (ref 0–1)
EOS ABS: 0 10*3/uL (ref 0.0–0.7)
EOS PCT: 1 % (ref 0–5)
HCT: 39.7 % (ref 36.0–46.0)
Hemoglobin: 13.9 g/dL (ref 12.0–15.0)
Lymphocytes Relative: 54 % — ABNORMAL HIGH (ref 12–46)
Lymphs Abs: 2.9 10*3/uL (ref 0.7–4.0)
MCH: 30.9 pg (ref 26.0–34.0)
MCHC: 35 g/dL (ref 30.0–36.0)
MCV: 88.2 fL (ref 78.0–100.0)
Monocytes Absolute: 0.3 10*3/uL (ref 0.1–1.0)
Monocytes Relative: 6 % (ref 3–12)
Neutro Abs: 2.1 10*3/uL (ref 1.7–7.7)
Neutrophils Relative %: 39 % — ABNORMAL LOW (ref 43–77)
Platelets: 260 10*3/uL (ref 150–400)
RBC: 4.5 MIL/uL (ref 3.87–5.11)
RDW: 12 % (ref 11.5–15.5)
WBC: 5.3 10*3/uL (ref 4.0–10.5)

## 2014-03-25 MED ORDER — KETOROLAC TROMETHAMINE 30 MG/ML IJ SOLN
30.0000 mg | Freq: Once | INTRAMUSCULAR | Status: AC
  Administered 2014-03-25: 30 mg via INTRAVENOUS
  Filled 2014-03-25: qty 1

## 2014-03-25 NOTE — ED Notes (Signed)
Onset x 2-3 days  Of chest discomfort  With deep breathing and cough.  States also has head/sinus congestion

## 2014-03-25 NOTE — ED Notes (Signed)
Pt presents to ED with complaints of chest pain that started 3 days ago.

## 2014-03-25 NOTE — ED Provider Notes (Signed)
CSN: [id number]     Arrival date & time 03/25/14  1954 History   First MD Initiated Contact with Patient 03/25/14 2016     Chief Complaint  Patient presents with  . Chest Pain     (Consider location/radiation/quality/duration/timing/severity/associated sxs/prior Treatment) HPI Comments: Patient presents today with a chief complaint of chest pain.  She reports that the pain is located left anterior chest and does not radiate.  Pain worse with movement and palpation.  The pain has been constant since she had a seizure around 10 AM this morning.  She reports that she did fall when she had the seizure and is unsure if she hit anything when she fell.  She does have a history of Seizure Disorder and had been on Keppra in the past.  She reports that she was taken off of the Keppra by Neurology because it was felt that she no longer needed it.  She has not had any other seizures since 10 AM.  She denies headache, nausea, vomiting, vision changes, abdominal pain, back pain, neck pain, SOB, cough, fever, or chills.  She has not taken anything for the pain.  She denies prolonged travel or surgeries in the past 4 weeks.  Denies LE edema.  Denies history of PE or DVT.  Denies use of Estrogen containing medications.  Denies cardiac history.  Denies any family history of cardiac disease or death of unknown cause at a young age.   The history is provided by the patient.    Past Medical History  Diagnosis Date  . Vaginosis   . Seizures     x1, after taking flagyl  . Shortness of breath   . Headache(784.0)   . Infection     UTI, yeast   Past Surgical History  Procedure Laterality Date  . Appendectomy  2008   History reviewed. No pertinent family history. History  Substance Use Topics  . Smoking status: Former Smoker    Types: Cigarettes  . Smokeless tobacco: Never Used  . Alcohol Use: Yes     Comment: last Saturday   OB History    Gravida Para Term Preterm AB TAB SAB Ectopic Multiple Living   0               Review of Systems  All other systems reviewed and are negative.     Allergies  Flagyl  Home Medications   Prior to Admission medications   Medication Sig Start Date End Date Taking? Authorizing Provider  ALPRAZolam (XANAX) 0.25 MG tablet Take 0.25 mg by mouth 2 (two) times daily as needed for anxiety.    Historical Provider, MD  cephALEXin (KEFLEX) 500 MG capsule Take 1 capsule (500 mg total) by mouth 3 (three) times daily. 01/29/14   Courtney A Forcucci, PA-C  clindamycin (CLEOCIN) 300 MG capsule Take 1 capsule (300 mg total) by mouth 3 (three) times daily. 01/20/14   Wilmer FloorLisa A Leftwich-Kirby, CNM  HYDROcodone-acetaminophen (NORCO/VICODIN) 5-325 MG per tablet Take 1 tablet by mouth every 6 (six) hours as needed for moderate pain or severe pain. 01/29/14   Courtney A Forcucci, PA-C  ondansetron (ZOFRAN) 4 MG tablet Take 1 tablet (4 mg total) by mouth every 6 (six) hours. 01/29/14   Courtney A Forcucci, PA-C   BP 129/87 mmHg  Pulse 75  Temp(Src) 98.1 F (36.7 C) (Oral)  Resp 18  Ht 5\' 6"  (1.676 m)  Wt 117 lb (53.071 kg)  BMI 18.89 kg/m2  SpO2 100%  LMP 03/14/2014 Physical  Exam  Constitutional: She is oriented to person, place, and time. She appears well-developed and well-nourished.  HENT:  Head: Normocephalic and atraumatic.  Mouth/Throat: Oropharynx is clear and moist.  Eyes: EOM are normal. Pupils are equal, round, and reactive to light.  Neck: Normal range of motion. Neck supple.  Cardiovascular: Normal rate, regular rhythm and normal heart sounds.   Pulses:      Dorsalis pedis pulses are 2+ on the right side, and 2+ on the left side.  Pulmonary/Chest: Effort normal and breath sounds normal. No respiratory distress. She has no wheezes. She has no rales. She exhibits tenderness.  Abdominal: Soft. Bowel sounds are normal. She exhibits no distension and no mass. There is no tenderness. There is no rebound and no guarding.  Musculoskeletal: Normal range of motion.   No LE edema bilaterally  Neurological: She is alert and oriented to person, place, and time. She has normal strength. No cranial nerve deficit or sensory deficit. Coordination and gait normal.  Skin: Skin is warm and dry.  Psychiatric: She has a normal mood and affect.  Nursing note and vitals reviewed.   ED Course  Procedures (including critical care time) Labs Review Labs Reviewed - No data to display  Imaging Review No results found.   EKG Interpretation   Date/Time:  Tuesday March 25 2014 20:10:18 EST Ventricular Rate:  77 PR Interval:  136 QRS Duration: 88 QT Interval:  350 QTC Calculation: 396 R Axis:   76 Text Interpretation:  Normal sinus rhythm Normal ECG No old tracing to  compare Confirmed by CAMPOS  MD, Caryn BeeKEVIN ([id number]) on 03/25/2014 8:15:39 PM      MDM   Final diagnoses:  Chest pain   Patient presents today with chest pain.  Pain has been present since she had a seizure this morning.  No ischemic changes on EKG.  CXR is negative.  Labs unremarkable.  She is PERC negative.  No prior cardiac history.  Chest wall tender to palpation.  Suspect that the pain is musculoskeletal.  She does have a history of Seizures.  She states that she was taken off the Keppra.  She has a normal neurological exam in the ED and is at baseline.  She is not post ictal.  Instructed patient to follow up with Neurology.  Patient also told not to drive until she is cleared by Neurology.  She verbalizes understanding.  Return precautions given.      Santiago GladHeather Cristle Jared, PA-C 03/27/14 2316  Lyanne CoKevin M Campos, MD 03/28/14 [phone]

## 2014-03-25 NOTE — Discharge Instructions (Signed)

## 2014-08-02 ENCOUNTER — Emergency Department (HOSPITAL_COMMUNITY)
Admission: EM | Admit: 2014-08-02 | Discharge: 2014-08-03 | Disposition: A | Discharge status: Home / Self Care | Payer: BLUE CROSS/BLUE SHIELD | Attending: Emergency Medicine | Admitting: Emergency Medicine

## 2014-08-02 ENCOUNTER — Encounter (HOSPITAL_COMMUNITY): Payer: Self-pay | Admitting: Emergency Medicine

## 2014-08-02 ENCOUNTER — Emergency Department (HOSPITAL_COMMUNITY): Payer: BLUE CROSS/BLUE SHIELD

## 2014-08-02 DIAGNOSIS — Z87891 Personal history of nicotine dependence: Secondary | ICD-10-CM | POA: Diagnosis not present

## 2014-08-02 DIAGNOSIS — Z3202 Encounter for pregnancy test, result negative: Secondary | ICD-10-CM | POA: Insufficient documentation

## 2014-08-02 DIAGNOSIS — Z792 Long term (current) use of antibiotics: Secondary | ICD-10-CM | POA: Insufficient documentation

## 2014-08-02 DIAGNOSIS — R569 Unspecified convulsions: Secondary | ICD-10-CM | POA: Insufficient documentation

## 2014-08-02 DIAGNOSIS — N390 Urinary tract infection, site not specified: Secondary | ICD-10-CM | POA: Diagnosis not present

## 2014-08-02 DIAGNOSIS — R0789 Other chest pain: Secondary | ICD-10-CM | POA: Insufficient documentation

## 2014-08-02 LAB — BASIC METABOLIC PANEL
Anion gap: 12 (ref 5–15)
BUN: 9 mg/dL (ref 6–23)
CALCIUM: 9.1 mg/dL (ref 8.4–10.5)
CO2: 20 mmol/L (ref 19–32)
Chloride: 105 mmol/L (ref 96–112)
Creatinine, Ser: 0.83 mg/dL (ref 0.50–1.10)
GFR calc Af Amer: >90 mL/min (ref >90)
GLUCOSE: 102 mg/dL — AB (ref 70–99)
Potassium: 3.4 mmol/L — ABNORMAL LOW (ref 3.5–5.1)
SODIUM: 137 mmol/L (ref 135–145)

## 2014-08-02 LAB — CBC
HCT: 38.4 % (ref 36.0–46.0)
Hemoglobin: 13.5 g/dL (ref 12.0–15.0)
MCH: 30.7 pg (ref 26.0–34.0)
MCHC: 35.2 g/dL (ref 30.0–36.0)
MCV: 87.3 fL (ref 78.0–100.0)
Platelets: 237 10*3/uL (ref 150–400)
RBC: 4.4 MIL/uL (ref 3.87–5.11)
RDW: 12.2 % (ref 11.5–15.5)
WBC: 5.7 10*3/uL (ref 4.0–10.5)

## 2014-08-02 LAB — URINALYSIS, ROUTINE W REFLEX MICROSCOPIC
BILIRUBIN URINE: NEGATIVE
Glucose, UA: NEGATIVE mg/dL
Hgb urine dipstick: NEGATIVE
Ketones, ur: 15 mg/dL — AB
Nitrite: POSITIVE — AB
Protein, ur: NEGATIVE mg/dL
SPECIFIC GRAVITY, URINE: 1.025 (ref 1.005–1.030)
UROBILINOGEN UA: 1 mg/dL (ref 0.0–1.0)
pH: 7 (ref 5.0–8.0)

## 2014-08-02 LAB — POC URINE PREG, ED: PREG TEST UR: NEGATIVE

## 2014-08-02 LAB — I-STAT TROPONIN, ED: Troponin i, poc: 0 ng/mL (ref 0.00–0.08)

## 2014-08-02 LAB — URINE MICROSCOPIC-ADD ON

## 2014-08-02 LAB — D-DIMER, QUANTITATIVE: D-Dimer, Quant: <0.27 ug/mL-FEU (ref 0.00–0.48)

## 2014-08-02 MED ORDER — CEFTRIAXONE SODIUM 1 G IJ SOLR
1.0000 g | Freq: Once | INTRAMUSCULAR | Status: AC
  Administered 2014-08-02: 1 g via INTRAVENOUS
  Filled 2014-08-02: qty 10

## 2014-08-02 MED ORDER — KETOROLAC TROMETHAMINE 15 MG/ML IJ SOLN
15.0000 mg | Freq: Once | INTRAMUSCULAR | Status: AC
  Administered 2014-08-02: 15 mg via INTRAVENOUS
  Filled 2014-08-02: qty 1

## 2014-08-02 NOTE — ED Provider Notes (Signed)
CSN: [id number]     Arrival date & time 08/02/14  1956 History   First MD Initiated Contact with Patient 08/02/14 2007     Chief Complaint  Patient presents with  . Chest Pain  . Seizures     Patient is a 19 y.o. female presenting with chest pain and seizures. The history is provided by the patient. No language interpreter was used.  Chest Pain Seizures  Theresa Marshall presents for evaluation of chest pain.  She woke up this morning with left-sided chest pain. The pain is located on the left chest. It is still in nature. The pain is worse with activity, deep breaths, lying on her stomach. She denies any associated cough or shortness of breath. She denies any fevers, vomiting, diarrhea. She has a history of seizure disorder and she stopped taking her Keppra a year ago. She did not like the way it made her feel. She has a history of seizures that are described as a staring episode and sometimes she passes out and has slight shaking. When she was in the lobby and waiting to check in as a patient for her chest pain she had an episode where she collapsed to the floor. She was caught by her friend and had fluttering of her eyelids and mild shaking of her arms and legs. She had no tongue biting, no bowel or bladder incontinence. She did have preceding symptoms and states this is typical for one of her seizures.  Past Medical History  Diagnosis Date  . Vaginosis   . Seizures     x1, after taking flagyl  . Shortness of breath   . Headache(784.0)   . Infection     UTI, yeast   Past Surgical History  Procedure Laterality Date  . Appendectomy  2008   No family history on file. History  Substance Use Topics  . Smoking status: Former Smoker    Types: Cigarettes  . Smokeless tobacco: Never Used  . Alcohol Use: Yes     Comment: last Saturday   OB History    Gravida Para Term Preterm AB TAB SAB Ectopic Multiple Living   0              Review of Systems  Cardiovascular: Positive for chest pain.   Neurological: Positive for seizures.  All other systems reviewed and are negative.     Allergies  Flagyl  Home Medications   Prior to Admission medications   Medication Sig Start Date End Date Taking? Authorizing Provider  ALPRAZolam (XANAX) 0.25 MG tablet Take 0.25 mg by mouth 2 (two) times daily as needed for anxiety.    Historical Provider, MD  cephALEXin (KEFLEX) 500 MG capsule Take 1 capsule (500 mg total) by mouth 3 (three) times daily. 01/29/14   Courtney Forcucci, PA-C  clindamycin (CLEOCIN) 300 MG capsule Take 1 capsule (300 mg total) by mouth 3 (three) times daily. 01/20/14   Wilmer Floor Leftwich-Kirby, CNM  HYDROcodone-acetaminophen (NORCO/VICODIN) 5-325 MG per tablet Take 1 tablet by mouth every 6 (six) hours as needed for moderate pain or severe pain. 01/29/14   Courtney Forcucci, PA-C  ondansetron (ZOFRAN) 4 MG tablet Take 1 tablet (4 mg total) by mouth every 6 (six) hours. 01/29/14   Courtney Forcucci, PA-C   BP 118/70 mmHg  Pulse 85  Resp 19  SpO2 100% Physical Exam  Constitutional: She is oriented to person, place, and time. She appears well-developed and well-nourished.  HENT:  Head: Normocephalic and atraumatic.  Eyes:  EOM are normal. Pupils are equal, round, and reactive to light.  Neck: Neck supple.  Cardiovascular: Normal rate and regular rhythm.   No murmur heard. Pulmonary/Chest: Effort normal and breath sounds normal. No respiratory distress. She exhibits no tenderness.  Abdominal: Soft. There is no tenderness. There is no rebound and no guarding.  Musculoskeletal: She exhibits no edema or tenderness.  Neurological: She is alert and oriented to person, place, and time. No cranial nerve deficit. She exhibits normal muscle tone. Coordination normal.  Skin: Skin is warm and dry.  Psychiatric: She has a normal mood and affect. Her behavior is normal.  Nursing note and vitals reviewed.   ED Course  Procedures (including critical care time) Labs Review Labs  Reviewed  BASIC METABOLIC PANEL - Abnormal; Notable for the following:    Potassium 3.4 (*)    Glucose, Bld 102 (*)    All other components within normal limits  URINALYSIS, ROUTINE W REFLEX MICROSCOPIC - Abnormal; Notable for the following:    APPearance CLOUDY (*)    Ketones, ur 15 (*)    Nitrite POSITIVE (*)    Leukocytes, UA SMALL (*)    All other components within normal limits  URINE MICROSCOPIC-ADD ON - Abnormal; Notable for the following:    Squamous Epithelial / LPF FEW (*)    Bacteria, UA MANY (*)    All other components within normal limits  CBC  D-DIMER, QUANTITATIVE  I-STAT TROPOININ, ED  POC URINE PREG, ED    Imaging Review Dg Chest 2 View  08/02/2014   CLINICAL DATA:  Chest pain and seizure  EXAM: CHEST  2 VIEW  COMPARISON:  03/25/2014  FINDINGS: The heart size and mediastinal contours are within normal limits. Both lungs are clear. The visualized skeletal structures are unremarkable.  IMPRESSION: No active cardiopulmonary disease.   Electronically Signed   By: Marnee SpringJonathon  Watts M.D.   On: 08/02/2014 23:34     EKG Interpretation   Date/Time:  Saturday August 02 2014 20:04:17 EDT Ventricular Rate:  93 PR Interval:  146 QRS Duration: 84 QT Interval:  340 QTC Calculation: 422 R Axis:   81 Text Interpretation:  Normal sinus rhythm Normal ECG Artifact Confirmed by  Theresa Marshall, Theresa Marshall([id number]) on 08/02/2014 8:37:37 PM      MDM   Final diagnoses:  Musculoskeletal chest pain  Seizure  UTI (lower urinary tract infection)    Patient here for evaluation of chest pain. Patient happened to have a seizure episode prior to evaluation in the ED. Patient does have a history of seizure disorder and is noncompliant with her medications. Providing prescription for her seizure medications to restart at home. In terms of chest pain, this is not consistent with ACS, PE, dissection, gastritis. Pain is likely musculoskeletal in nature and reproducible with some activities, recommend  ibuprofen at home for pain. UA is consistent with UTI treating with Keflex. Discussed with patient PCP follow-up as well as neurology follow-up and return precautions.    Theresa FossaElizabeth Aleyda Gindlesperger, MD 08/03/14 0030

## 2014-08-02 NOTE — ED Notes (Signed)
Friend reported that the pt.called him this evening complaining of chest pain , pt. had a syncopal episode/petit mal seizure at triage .

## 2014-08-02 NOTE — ED Notes (Signed)
EDP at bedside  

## 2014-08-03 MED ORDER — LEVETIRACETAM 500 MG PO TABS: 500.0000 mg | ORAL_TABLET | Freq: Two times a day (BID) | ORAL | Status: DC

## 2014-08-03 MED ORDER — CEPHALEXIN 500 MG PO CAPS: 500.0000 mg | ORAL_CAPSULE | Freq: Two times a day (BID) | ORAL | Status: DC

## 2014-08-03 NOTE — Discharge Instructions (Signed)
Chest Wall Pain Chest wall pain is pain in or around the bones and muscles of your chest. It may take up to 6 weeks to get better. It may take longer if you must stay physically active in your work and activities.  CAUSES  Chest wall pain may happen on its own. However, it may be caused by:  A viral illness like the flu.  Injury.  Coughing.  Exercise.  Arthritis.  Fibromyalgia.  Shingles. HOME CARE INSTRUCTIONS   Avoid overtiring physical activity. Try not to strain or perform activities that cause pain. This includes any activities using your chest or your abdominal and side muscles, especially if heavy weights are used.  Put ice on the sore area.  Put ice in a plastic bag.  Place a towel between your skin and the bag.  Leave the ice on for 15-20 minutes per hour while awake for the first 2 days.  Only take over-the-counter or prescription medicines for pain, discomfort, or fever as directed by your caregiver. SEEK IMMEDIATE MEDICAL CARE IF:   Your pain increases, or you are very uncomfortable.  You have a fever.  Your chest pain becomes worse.  You have new, unexplained symptoms.  You have nausea or vomiting.  You feel sweaty or lightheaded.  You have a cough with phlegm (sputum), or you cough up blood. MAKE SURE YOU:   Understand these instructions.  Will watch your condition.  Will get help right away if you are not doing well or get worse. Document Released: 04/04/2005 Document Revised: 06/27/2011 Document Reviewed: 11/29/2010 [facility] Patient Information 2015 [place], [place]. This information is not intended to replace advice given to you by your health care provider. Make sure you discuss any questions you have with your health care provider.  Driving and Equipment Restrictions Some medical problems make it dangerous to drive, ride a bike, or use machines. Some of these problems are:  A hard blow to the head (concussion).  Passing out  (fainting).  Twitching and shaking (seizures).  Low blood sugar.  Taking medicine to help you relax (sedatives).  Taking pain medicines.  Wearing an eye patch.  Wearing splints. This can make it hard to use parts of your body that you need to drive safely. HOME CARE   Do not drive until your doctor says it is okay.  Do not use machines until your doctor says it is okay. You may need a form signed by your doctor (medical release) before you can drive again. You may also need this form before you do other tasks where you need to be fully alert. MAKE SURE YOU:  Understand these instructions.  Will watch your condition.  Will get help right away if you are not doing well or get worse. Document Released: 05/12/2004 Document Revised: 06/27/2011 Document Reviewed: 08/12/2009 [facility] Patient Information 2015 [place], [place]. This information is not intended to replace advice given to you by your health care provider. Make sure you discuss any questions you have with your health care provider.  Urinary Tract Infection Urinary tract infections (UTIs) can develop anywhere along your urinary tract. Your urinary tract is your body's drainage system for removing wastes and extra water. Your urinary tract includes two kidneys, two ureters, a bladder, and a urethra. Your kidneys are a pair of bean-shaped organs. Each kidney is about the size of your fist. They are located below your ribs, one on each side of your spine. CAUSES Infections are caused by microbes, which are microscopic organisms,  including fungi, viruses, and bacteria. These organisms are so small that they can only be seen through a microscope. Bacteria are the microbes that most commonly cause UTIs. SYMPTOMS  Symptoms of UTIs may vary by age and gender of the patient and by the location of the infection. Symptoms in young women typically include a frequent and intense urge to urinate and a painful, burning feeling in the bladder or  urethra during urination. Older women and men are more likely to be tired, shaky, and weak and have muscle aches and abdominal pain. A fever may mean the infection is in your kidneys. Other symptoms of a kidney infection include pain in your back or sides below the ribs, nausea, and vomiting. DIAGNOSIS To diagnose a UTI, your caregiver will ask you about your symptoms. Your caregiver also will ask to provide a urine sample. The urine sample will be tested for bacteria and white blood cells. White blood cells are made by your body to help fight infection. TREATMENT  Typically, UTIs can be treated with medication. Because most UTIs are caused by a bacterial infection, they usually can be treated with the use of antibiotics. The choice of antibiotic and length of treatment depend on your symptoms and the type of bacteria causing your infection. HOME CARE INSTRUCTIONS  If you were prescribed antibiotics, take them exactly as your caregiver instructs you. Finish the medication even if you feel better after you have only taken some of the medication.  Drink enough water and fluids to keep your urine clear or pale yellow.  Avoid caffeine, tea, and carbonated beverages. They tend to irritate your bladder.  Empty your bladder often. Avoid holding urine for long periods of time.  Empty your bladder before and after sexual intercourse.  After a bowel movement, women should cleanse from front to back. Use each tissue only once. SEEK MEDICAL CARE IF:   You have back pain.  You develop a fever.  Your symptoms do not begin to resolve within 3 days. SEEK IMMEDIATE MEDICAL CARE IF:   You have severe back pain or lower abdominal pain.  You develop chills.  You have nausea or vomiting.  You have continued burning or discomfort with urination. MAKE SURE YOU:   Understand these instructions.  Will watch your condition.  Will get help right away if you are not doing well or get worse. Document  Released: 01/12/2005 Document Revised: 10/04/2011 Document Reviewed: 05/13/2011 [facility] Patient Information 2015 [place]. This information is not intended to replace advice given to you by your health care provider. Make sure you discuss any questions you have with your health care provider.

## 2014-08-12 ENCOUNTER — Ambulatory Visit: Payer: Self-pay | Admitting: Nurse Practitioner

## 2015-01-21 ENCOUNTER — Inpatient Hospital Stay (HOSPITAL_COMMUNITY)
Admission: AD | Admit: 2015-01-21 | Discharge: 2015-01-21 | Disposition: A | Discharge status: Home / Self Care | Payer: Medicaid Other | Source: Ambulatory Visit | Attending: Obstetrics and Gynecology | Admitting: Obstetrics and Gynecology

## 2015-01-21 ENCOUNTER — Encounter (HOSPITAL_COMMUNITY): Payer: Self-pay

## 2015-01-21 DIAGNOSIS — J029 Acute pharyngitis, unspecified: Secondary | ICD-10-CM | POA: Diagnosis present

## 2015-01-21 DIAGNOSIS — Z87891 Personal history of nicotine dependence: Secondary | ICD-10-CM | POA: Insufficient documentation

## 2015-01-21 DIAGNOSIS — R52 Pain, unspecified: Secondary | ICD-10-CM | POA: Diagnosis present

## 2015-01-21 DIAGNOSIS — R109 Unspecified abdominal pain: Secondary | ICD-10-CM | POA: Diagnosis present

## 2015-01-21 DIAGNOSIS — Z113 Encounter for screening for infections with a predominantly sexual mode of transmission: Secondary | ICD-10-CM

## 2015-01-21 LAB — INFLUENZA PANEL BY PCR (TYPE A & B)
H1N1FLUPCR: NOT DETECTED
Influenza A By PCR: NEGATIVE
Influenza B By PCR: NEGATIVE

## 2015-01-21 LAB — CBC
HCT: 37.9 % (ref 36.0–46.0)
Hemoglobin: 13 g/dL (ref 12.0–15.0)
MCH: 30.9 pg (ref 26.0–34.0)
MCHC: 34.3 g/dL (ref 30.0–36.0)
MCV: 90 fL (ref 78.0–100.0)
PLATELETS: 210 10*3/uL (ref 150–400)
RBC: 4.21 MIL/uL (ref 3.87–5.11)
RDW: 12.3 % (ref 11.5–15.5)
WBC: 2.9 10*3/uL — ABNORMAL LOW (ref 4.0–10.5)

## 2015-01-21 LAB — WET PREP, GENITAL
Clue Cells Wet Prep HPF POC: NONE SEEN
TRICH WET PREP: NONE SEEN
YEAST WET PREP: NONE SEEN

## 2015-01-21 LAB — URINALYSIS, ROUTINE W REFLEX MICROSCOPIC
Bilirubin Urine: NEGATIVE
Glucose, UA: NEGATIVE mg/dL
Hgb urine dipstick: NEGATIVE
KETONES UR: NEGATIVE mg/dL
LEUKOCYTES UA: NEGATIVE
NITRITE: NEGATIVE
Protein, ur: NEGATIVE mg/dL
SPECIFIC GRAVITY, URINE: 1.02 (ref 1.005–1.030)
Urobilinogen, UA: 0.2 mg/dL (ref 0.0–1.0)
pH: 7 (ref 5.0–8.0)

## 2015-01-21 LAB — POCT PREGNANCY, URINE: Preg Test, Ur: NEGATIVE

## 2015-01-21 LAB — RAPID STREP SCREEN (MED CTR MEBANE ONLY): Streptococcus, Group A Screen (Direct): NEGATIVE

## 2015-01-21 MED ORDER — CETIRIZINE HCL 10 MG PO TABS: 10.0000 mg | ORAL_TABLET | Freq: Every day | ORAL | Status: DC

## 2015-01-21 MED ORDER — PROMETHAZINE HCL 12.5 MG PO TABS
12.5000 mg | ORAL_TABLET | Freq: Four times a day (QID) | ORAL | Status: DC | PRN
Start: 2015-01-21 — End: 2017-07-09

## 2015-01-21 MED ORDER — KETOROLAC TROMETHAMINE 60 MG/2ML IM SOLN
60.0000 mg | Freq: Once | INTRAMUSCULAR | Status: AC
  Administered 2015-01-21: 60 mg via INTRAMUSCULAR
  Filled 2015-01-21: qty 2

## 2015-01-21 MED ORDER — IBUPROFEN 600 MG PO TABS: 600.0000 mg | ORAL_TABLET | Freq: Four times a day (QID) | ORAL | Status: DC | PRN

## 2015-01-21 MED ORDER — PROMETHAZINE HCL 12.5 MG PO TABS
12.5000 mg | ORAL_TABLET | Freq: Four times a day (QID) | ORAL | Status: DC | PRN
Start: 2015-01-21 — End: 2015-01-21

## 2015-01-21 NOTE — MAU Note (Signed)
Pt presents to MAU with complaints of body aches, sore throat, lower abdominal cramping for two days.

## 2015-01-21 NOTE — MAU Provider Note (Signed)
History     CSN: [id number]  Arrival date and time: 01/21/15 1307      Chief Complaint  Patient presents with  . Generalized Body Aches  . Sore Throat  . Abdominal Cramping   HPI  Theresa Marshall is a 19 y.o. female who presents for body aches, sore throat, abdominal cramping, and STD testing.  Generalized body aches & chills yesterday; did not check temp.  Sore throat & drainage since yesterday. Rates as 8/10. Took advil last night with mild relief. Bilateral ear pain a few days ago that resolved.  Denies cough or chest pain. No sick contacts; did not get flu vaccine.  Nausea x 2 days, no vomiting/diarrhea/constipation.  Lower abdominal cramping x 2 weeks. Rates as 7/10. Denies vaginal bleeding or discharge.  Implanon removed in July by [facility], no birth control or condoms now.  Wants STD testing. Has 1 partner; does not think she's had an exposure.     Past Medical History  Diagnosis Date  . Vaginosis   . Seizures (HCC)     x1, after taking flagyl  . Shortness of breath   . Headache(784.0)   . Infection     UTI, yeast    Past Surgical History  Procedure Laterality Date  . Appendectomy  2008    History reviewed. No pertinent family history.  Social History  Substance Use Topics  . Smoking status: Former Smoker    Types: Cigarettes  . Smokeless tobacco: Never Used  . Alcohol Use: Yes     Comment: last Saturday    Allergies:  Allergies  Allergen Reactions  . Flagyl [Metronidazole] Other (See Comments)    seizures    Prescriptions prior to admission  Medication Sig Dispense Refill Last Dose  . ALPRAZolam (XANAX) 0.25 MG tablet Take 0.25 mg by mouth 2 (two) times daily as needed for anxiety.   Past Week at Unknown time  . cephALEXin (KEFLEX) 500 MG capsule Take 1 capsule (500 mg total) by mouth 2 (two) times daily. 14 capsule 0   . etonogestrel (NEXPLANON) 68 MG IMPL implant 1 each by Subdermal route once.   08/02/2014 at Unknown time  .  levETIRAcetam (KEPPRA) 500 MG tablet Take 1 tablet (500 mg total) by mouth 2 (two) times daily. 30 tablet 0   . Multiple Vitamin (MULTIVITAMIN WITH MINERALS) TABS tablet Take 1 tablet by mouth daily.   Past Week at Unknown time    Review of Systems  Constitutional: Positive for chills. Negative for fever.  HENT: Positive for sore throat. Negative for congestion and ear pain.   Respiratory: Negative for cough and shortness of breath.   Cardiovascular: Negative.   Gastrointestinal: Positive for nausea and abdominal pain. Negative for vomiting, diarrhea and constipation.  Genitourinary: Negative.   Neurological: Negative for headaches.   Physical Exam   Blood pressure 121/70, pulse 58, temperature 97.7 F (36.5 C), resp. rate 18, height  (1.651 m), weight 128 lb (58.06 kg), last menstrual period 01/06/2015, SpO2 100 %.  Physical Exam  Nursing note and vitals reviewed. Constitutional: She is oriented to person, place, and time. She appears well-developed and well-nourished. No distress.  HENT:  Head: Normocephalic and atraumatic.  Right Ear: Tympanic membrane normal.  Left Ear: Tympanic membrane normal.  Nose: Mucosal edema present. Right sinus exhibits no maxillary sinus tenderness. Left sinus exhibits no maxillary sinus tenderness.  Mouth/Throat: Uvula is midline and mucous membranes are normal. Posterior oropharyngeal erythema (mild) present. No oropharyngeal exudate, posterior  oropharyngeal edema or tonsillar abscesses.  Eyes: Conjunctivae are normal. Right eye exhibits no discharge. Left eye exhibits no discharge. No scleral icterus.  Neck: Normal range of motion. Neck supple. No thyromegaly present.  Cardiovascular: Normal rate, regular rhythm and normal heart sounds.   No murmur heard. Respiratory: Effort normal and breath sounds normal. No respiratory distress. She has no wheezes.  GI: Soft. Bowel sounds are normal. She exhibits no distension. There is tenderness (mild lower  abdominal tenderness).  Genitourinary: Vagina normal and uterus normal. Cervix exhibits discharge (moderate amount of thin white discharge). Cervix exhibits no motion tenderness and no friability. Right adnexum displays no mass, no tenderness and no fullness. Left adnexum displays no mass, no tenderness and no fullness.  Lymphadenopathy:    She has no cervical adenopathy.  Neurological: She is alert and oriented to person, place, and time.  Skin: Skin is warm and dry. She is not diaphoretic.  Psychiatric: She has a normal mood and affect. Her behavior is normal. Judgment and thought content normal.    MAU Course  Procedures Results for orders placed or performed during the hospital encounter of 01/21/15 (from the past 24 hour(s))  Urinalysis, Routine w reflex microscopic (not at [facility])     Status: None   Collection Time: 01/21/15  1:45 PM  Result Value Ref Range   Color, Urine YELLOW YELLOW   APPearance CLEAR CLEAR   Specific Gravity, Urine 1.020 1.005 - 1.030   pH 7.0 5.0 - 8.0   Glucose, UA NEGATIVE NEGATIVE mg/dL   Hgb urine dipstick NEGATIVE NEGATIVE   Bilirubin Urine NEGATIVE NEGATIVE   Ketones, ur NEGATIVE NEGATIVE mg/dL   Protein, ur NEGATIVE NEGATIVE mg/dL   Urobilinogen, UA 0.2 0.0 - 1.0 mg/dL   Nitrite NEGATIVE NEGATIVE   Leukocytes, UA NEGATIVE NEGATIVE  Pregnancy, urine POC     Status: None   Collection Time: 01/21/15  1:59 PM  Result Value Ref Range   Preg Test, Ur NEGATIVE NEGATIVE  CBC     Status: Abnormal   Collection Time: 01/21/15  2:51 PM  Result Value Ref Range   WBC 2.9 (L) 4.0 - 10.5 K/uL   RBC 4.21 3.87 - 5.11 MIL/uL   Hemoglobin 13.0 12.0 - 15.0 g/dL   HCT 69.6 29.5 - 28.4 %   MCV 90.0 78.0 - 100.0 fL   MCH 30.9 26.0 - 34.0 pg   MCHC 34.3 30.0 - 36.0 g/dL   RDW 13.2 44.0 - 10.2 %   Platelets 210 150 - 400 K/uL  Wet prep, genital     Status: Abnormal   Collection Time: 01/21/15  3:15 PM  Result Value Ref Range   Yeast Wet Prep HPF POC NONE SEEN NONE  SEEN   Trich, Wet Prep NONE SEEN NONE SEEN   Clue Cells Wet Prep HPF POC NONE SEEN NONE SEEN   WBC, Wet Prep HPF POC MODERATE (A) NONE SEEN    MDM Afebrile while here Toradol given - symptoms improved STD testing per patient request  Assessment and Plan  A: 1. Allergic pharyngitis   2. Screening for STDs (sexually transmitted diseases)    P: Discharge home Rx zyrtec, phenergan, & ibuprofen Fine new PCP; go to PCP or urgent care for worsening symptoms GC/CT, HIV, RPR pending Strep swab & flu swab pending  Judeth Horn, NP  01/21/2015, 2:08 PM

## 2015-01-21 NOTE — Discharge Instructions (Signed)
Hay Fever Hay fever is an allergic reaction to particles in the air. It cannot be passed from person to person. It cannot be cured, but it can be controlled. CAUSES  Hay fever is caused by something that triggers an allergic reaction (allergens). The following are examples of allergens:  Ragweed.  Feathers.  Animal dander.  Grass and tree pollens.  Cigarette smoke.  House dust.  Pollution. SYMPTOMS   Sneezing.  Runny or stuffy nose.  Tearing eyes.  Itchy eyes, nose, mouth, throat, skin, or other area.  Sore throat.  Headache.  Decreased sense of smell or taste. DIAGNOSIS Your caregiver will perform a physical exam and ask questions about the symptoms you are having.Allergy testing may be done to determine exactly what triggers your hay fever.  TREATMENT   Over-the-counter medicines may help symptoms. These include:  Antihistamines.  Decongestants. These may help with nasal congestion.  Your caregiver may prescribe medicines if over-the-counter medicines do not work.  Some people benefit from allergy shots when other medicines are not helpful. HOME CARE INSTRUCTIONS   Avoid the allergen that is causing your symptoms, if possible.  Take all medicine as told by your caregiver. SEEK MEDICAL CARE IF:   You have severe allergy symptoms and your current medicines are not helping.  Your treatment was working at one time, but you are now experiencing symptoms.  You have sinus congestion and pressure.  You develop a fever or headache.  You have thick nasal discharge.  You have asthma and have a worsening cough and wheezing. SEEK IMMEDIATE MEDICAL CARE IF:   You have swelling of your tongue or lips.  You have trouble breathing.  You feel lightheaded or like you are going to faint.  You have cold sweats.  You have a fever.   This information is not intended to replace advice given to you by your health care provider. Make sure you discuss any  questions you have with your health care provider.   Document Released: 04/04/2005 Document Revised: 06/27/2011 Document Reviewed: 10/15/2014 Elsevier Interactive Patient Education 2016 [employer].     Safe Sex Safe sex is about reducing the risk of giving or getting a sexually transmitted disease (STD). STDs are spread through sexual contact involving the genitals, mouth, or rectum. Some STDs can be cured and others cannot. Safe sex can also prevent unintended pregnancies.  WHAT ARE SOME SAFE SEX PRACTICES?  Limit your sexual activity to only one partner who is having sex with only you.  Talk to your partner about his or her past partners, past STDs, and drug use.  Use a condom every time you have sexual intercourse. This includes vaginal, oral, and anal sexual activity. Both females and males should wear condoms during oral sex. Only use latex or polyurethane condoms and water-based lubricants. Using petroleum-based lubricants or oils to lubricate a condom will weaken the condom and increase the chance that it will break. The condom should be in place from the beginning to the end of sexual activity. Wearing a condom reduces, but does not completely eliminate, your risk of getting or giving an STD. STDs can be spread by contact with infected body fluids and skin.  Get vaccinated for hepatitis B and HPV.  Avoid alcohol and recreational drugs, which can affect your judgment. You may forget to use a condom or participate in high-risk sex.  For females, avoid douching after sexual intercourse. Douching can spread an infection farther into the reproductive tract.  Check your body  for signs of sores, blisters, rashes, or unusual discharge. See your health care provider if you notice any of these signs.  Avoid sexual contact if you have symptoms of an infection or are being treated for an STD. If you or your partner has herpes, avoid sexual contact when blisters are present. Use condoms at  all other times.  If you are at risk of being infected with HIV, it is recommended that you take a prescription medicine daily to prevent HIV infection. This is called pre-exposure prophylaxis (PrEP). You are considered at risk if:  You are a man who has sex with other men (MSM).  You are a heterosexual man or woman who is sexually active with more than one partner.  You take drugs by injection.  You are sexually active with a partner who has HIV.  Talk with your health care provider about whether you are at high risk of being infected with HIV. If you choose to begin PrEP, you should first be tested for HIV. You should then be tested every 3 months for as long as you are taking PrEP.  See your health care provider for regular screenings, exams, and tests for other STDs. Before having sex with a new partner, each of you should be screened for STDs and should talk about the results with each other. WHAT ARE THE BENEFITS OF SAFE SEX?   There is less chance of getting or giving an STD.  You can prevent unwanted or unintended pregnancies.  By discussing safe sex concerns with your partner, you may increase feelings of intimacy, comfort, trust, and honesty between the two of you.   This information is not intended to replace advice given to you by your health care provider. Make sure you discuss any questions you have with your health care provider.   Document Released: 05/12/2004 Document Revised: 04/25/2014 Document Reviewed: 09/26/2011 Elsevier Interactive Patient Education [employer]

## 2015-01-22 LAB — HIV ANTIBODY (ROUTINE TESTING W REFLEX): HIV SCREEN 4TH GENERATION: NONREACTIVE

## 2015-01-22 LAB — GC/CHLAMYDIA PROBE AMP (~~LOC~~) NOT AT ARMC
Chlamydia: NEGATIVE
Neisseria Gonorrhea: NEGATIVE

## 2015-01-22 LAB — RPR: RPR Ser Ql: NONREACTIVE

## 2015-01-23 LAB — CULTURE, GROUP A STREP: STREP A CULTURE: NEGATIVE

## 2015-04-27 ENCOUNTER — Encounter (HOSPITAL_COMMUNITY): Payer: Self-pay | Admitting: Family Medicine

## 2015-04-27 ENCOUNTER — Emergency Department (HOSPITAL_COMMUNITY)
Admission: EM | Admit: 2015-04-27 | Discharge: 2015-04-27 | Disposition: A | Discharge status: Home / Self Care | Payer: BLUE CROSS/BLUE SHIELD | Attending: Emergency Medicine | Admitting: Emergency Medicine

## 2015-04-27 DIAGNOSIS — Z87440 Personal history of urinary (tract) infections: Secondary | ICD-10-CM | POA: Insufficient documentation

## 2015-04-27 DIAGNOSIS — J020 Streptococcal pharyngitis: Secondary | ICD-10-CM

## 2015-04-27 DIAGNOSIS — Z79899 Other long term (current) drug therapy: Secondary | ICD-10-CM | POA: Insufficient documentation

## 2015-04-27 DIAGNOSIS — M791 Myalgia: Secondary | ICD-10-CM | POA: Insufficient documentation

## 2015-04-27 DIAGNOSIS — Z87891 Personal history of nicotine dependence: Secondary | ICD-10-CM | POA: Insufficient documentation

## 2015-04-27 DIAGNOSIS — R5383 Other fatigue: Secondary | ICD-10-CM | POA: Insufficient documentation

## 2015-04-27 LAB — RAPID STREP SCREEN (MED CTR MEBANE ONLY): STREPTOCOCCUS, GROUP A SCREEN (DIRECT): POSITIVE — AB

## 2015-04-27 MED ORDER — PENICILLIN G BENZATHINE 1200000 UNIT/2ML IM SUSP
1.2000 10*6.[IU] | Freq: Once | INTRAMUSCULAR | Status: AC
  Administered 2015-04-27: 1.2 10*6.[IU] via INTRAMUSCULAR
  Filled 2015-04-27: qty 2

## 2015-04-27 NOTE — ED Notes (Signed)
Declined W/C at D/C and was escorted to lobby by RN. 

## 2015-04-27 NOTE — Discharge Instructions (Signed)
Theresa Marshall,  Nice meeting you! Please follow-up with your primary care provider. Return to the emergency department if you develop shortness of breath, drooling, increased pain, or do not improve despite antibiotics. Feel better soon!  S. Lane HackerNicole Lailana Shira, PA-C   Sore Throat A sore throat is pain, burning, irritation, or scratchiness of the throat. There is often pain or tenderness when swallowing or talking. A sore throat may be accompanied by other symptoms, such as coughing, sneezing, fever, and swollen neck glands. A sore throat is often the first sign of another sickness, such as a cold, flu, strep throat, or mononucleosis (commonly known as mono). Most sore throats go away without medical treatment. CAUSES  The most common causes of a sore throat include:  A viral infection, such as a cold, flu, or mono.  A bacterial infection, such as strep throat, tonsillitis, or whooping cough.  Seasonal allergies.  Dryness in the air.  Irritants, such as smoke or pollution.  Gastroesophageal reflux disease (GERD). HOME CARE INSTRUCTIONS   Only take over-the-counter medicines as directed by your caregiver.  Drink enough fluids to keep your urine clear or pale yellow.  Rest as needed.  Try using throat sprays, lozenges, or sucking on hard candy to ease any pain (if older than 4 years or as directed).  Sip warm liquids, such as broth, herbal tea, or warm water with honey to relieve pain temporarily. You may also eat or drink cold or frozen liquids such as frozen ice pops.  Gargle with salt water (mix 1 tsp salt with 8 oz of water).  Do not smoke and avoid secondhand smoke.  Put a cool-mist humidifier in your bedroom at night to moisten the air. You can also turn on a hot shower and sit in the bathroom with the door closed for 5-10 minutes. SEEK IMMEDIATE MEDICAL CARE IF:  You have difficulty breathing.  You are unable to swallow fluids, soft foods, or your saliva.  You have  increased swelling in the throat.  Your sore throat does not get better in 7 days.  You have nausea and vomiting.  You have a fever or persistent symptoms for more than 2-3 days.  You have a fever and your symptoms suddenly get worse. MAKE SURE YOU:   Understand these instructions.  Will watch your condition.  Will get help right away if you are not doing well or get worse.   This information is not intended to replace advice given to you by your health care provider. Make sure you discuss any questions you have with your health care provider.   Document Released: 05/12/2004 Document Revised: 04/25/2014 Document Reviewed: 12/11/2011 Elsevier Interactive Patient Education [employer] Inc[employer]

## 2015-04-27 NOTE — ED Provider Notes (Signed)
CSN: [id number]     Arrival date & time 04/27/15  1117 History  By signing my name below, I, Phillis HaggisGabriella Gaje, attest that this documentation has been prepared under the direction and in the presence of Lane HackerNicole Saveah Bahar, PA-C. Electronically Signed: Phillis HaggisGabriella Gaje, ED Scribe. 04/27/2015. 1:02 PM.   Chief Complaint  Patient presents with  . Sore Throat   Patient is a 20 y.o. female presenting with pharyngitis. The history is provided by the patient. No language interpreter was used.  Sore Throat This is a new problem. The current episode started more than 2 days ago. The problem occurs constantly. The problem has been gradually worsening. Pertinent negatives include no chest pain and no abdominal pain. She has tried nothing for the symptoms.  HPI Comments: Theresa Marshall is a 20 y.o. female who presents to the Emergency Department complaining of sore throat onset three days ago. She reports associated fatigue that started this morning. She states that her boyfriend was just diagnosed with strep and fever. She denies fever, chest pain, abdominal pain, nausea, or vomiting. Pt is allergic to Flagyl.   Past Medical History  Diagnosis Date  . Vaginosis   . Seizures (HCC)     x1, after taking flagyl  . Shortness of breath   . Headache(784.0)   . Infection     UTI, yeast   Past Surgical History  Procedure Laterality Date  . Appendectomy  2008   History reviewed. No pertinent family history. Social History  Substance Use Topics  . Smoking status: Former Smoker    Types: Cigarettes  . Smokeless tobacco: Never Used  . Alcohol Use: Yes     Comment: last Saturday   OB History    Gravida Para Term Preterm AB TAB SAB Ectopic Multiple Living   0              Review of Systems  Constitutional: Positive for fatigue. Negative for fever.  HENT: Positive for sore throat.   Cardiovascular: Negative for chest pain.  Gastrointestinal: Negative for nausea, vomiting and abdominal pain.   Musculoskeletal: Positive for myalgias.  All other systems reviewed and are negative.     Allergies  Flagyl  Home Medications   Prior to Admission medications   Medication Sig Start Date End Date Taking? Authorizing Provider  ALPRAZolam (XANAX) 0.25 MG tablet Take 0.25 mg by mouth 2 (two) times daily as needed for anxiety.    Historical Provider, MD  cetirizine (ZYRTEC) 10 MG tablet Take 1 tablet (10 mg total) by mouth daily. 01/21/15   Judeth HornErin Lawrence, NP  ibuprofen (ADVIL,MOTRIN) 600 MG tablet Take 1 tablet (600 mg total) by mouth every 6 (six) hours as needed. 01/21/15   Judeth HornErin Lawrence, NP  levETIRAcetam (KEPPRA) 500 MG tablet Take 1 tablet (500 mg total) by mouth 2 (two) times daily. 08/03/14   Tilden FossaElizabeth Rees, MD  Multiple Vitamin (MULTIVITAMIN WITH MINERALS) TABS tablet Take 1 tablet by mouth daily.    Historical Provider, MD  promethazine (PHENERGAN) 12.5 MG tablet Take 1 tablet (12.5 mg total) by mouth every 6 (six) hours as needed for nausea or vomiting. 01/21/15   Judeth HornErin Lawrence, NP   BP 102/81 mmHg  Pulse 66  Temp(Src) 97.6 F (36.4 C) (Oral)  Resp 14  Ht 5\' 6"  (1.676 m)  Wt 128 lb 7 oz (58.259 kg)  BMI 20.74 kg/m2  SpO2 99%  LMP 03/28/2015 Physical Exam  Constitutional: She appears well-developed and well-nourished. No distress.  HENT:  Head: Normocephalic  and atraumatic.  Right Ear: External ear normal.  Left Ear: External ear normal.  Nose: Nose normal.  Mouth/Throat: Oropharyngeal exudate present.  Erythematous oropharynx  Eyes: Conjunctivae are normal. Pupils are equal, round, and reactive to light. Right eye exhibits no discharge. Left eye exhibits no discharge. No scleral icterus.  Neck: Normal range of motion. Neck supple. No tracheal deviation present.  Cardiovascular: Normal rate, regular rhythm, normal heart sounds and intact distal pulses.  Exam reveals no gallop and no friction rub.   No murmur heard. Pulmonary/Chest: Effort normal and breath sounds normal.  No respiratory distress. She has no wheezes. She has no rales. She exhibits no tenderness.  Abdominal: Soft. Bowel sounds are normal. She exhibits no distension and no mass. There is no tenderness. There is no rebound and no guarding.  Musculoskeletal: She exhibits no edema.  Lymphadenopathy:    She has no cervical adenopathy.  Neurological: She is alert. Coordination normal.  Skin: Skin is warm and dry. No rash noted. She is not diaphoretic. No erythema.  Psychiatric: She has a normal mood and affect. Her behavior is normal.  Nursing note and vitals reviewed.   ED Course  Procedures  DIAGNOSTIC STUDIES: Oxygen Saturation is 99% on RA, normal by my interpretation.    COORDINATION OF CARE: 1:04 PM-Discussed treatment plan which includes penicillin with pt at bedside and pt agreed to plan.   Labs Review Labs Reviewed  RAPID STREP SCREEN (NOT AT [facility]) - Abnormal; Notable for the following:    Streptococcus, Group A Screen (Direct) POSITIVE (*)    All other components within normal limits    MDM  Pt febrile with tonsillar exudate, cervical lymphadenopathy, & dysphagia; diagnosis of strep. Patient given PCN IM. Pt appears mildly dehydrated, discussed importance of water rehydration. Presentation non concerning for PTA or infxn spread to soft tissue. No trismus or uvula deviation. Specific return precautions discussed. Patient may be safely discharged home. Discussed reasons for return. Patient to follow-up with primary care provider within one week. Patient in understanding and agreement with the plan.  Final diagnoses:  Strep throat   I personally performed the services described in this documentation, which was scribed in my presence. The recorded information has been reviewed and is accurate.   Melton Krebs, PA-C 04/29/15 0532  Margarita Grizzle, MD 05/02/15 [phone]

## 2015-04-27 NOTE — ED Notes (Signed)
Pt sts started feeling bad this am with sore throat and weak all over.

## 2015-05-20 ENCOUNTER — Emergency Department (HOSPITAL_COMMUNITY)
Admission: EM | Admit: 2015-05-20 | Discharge: 2015-05-20 | Disposition: A | Discharge status: Home / Self Care | Payer: BLUE CROSS/BLUE SHIELD | Attending: Emergency Medicine | Admitting: Emergency Medicine

## 2015-05-20 ENCOUNTER — Encounter (HOSPITAL_COMMUNITY): Payer: Self-pay

## 2015-05-20 DIAGNOSIS — J029 Acute pharyngitis, unspecified: Secondary | ICD-10-CM | POA: Insufficient documentation

## 2015-05-20 DIAGNOSIS — Z9049 Acquired absence of other specified parts of digestive tract: Secondary | ICD-10-CM | POA: Insufficient documentation

## 2015-05-20 DIAGNOSIS — Z79899 Other long term (current) drug therapy: Secondary | ICD-10-CM | POA: Insufficient documentation

## 2015-05-20 DIAGNOSIS — Z8742 Personal history of other diseases of the female genital tract: Secondary | ICD-10-CM | POA: Insufficient documentation

## 2015-05-20 DIAGNOSIS — Z87440 Personal history of urinary (tract) infections: Secondary | ICD-10-CM | POA: Insufficient documentation

## 2015-05-20 DIAGNOSIS — Z87891 Personal history of nicotine dependence: Secondary | ICD-10-CM | POA: Insufficient documentation

## 2015-05-20 DIAGNOSIS — R197 Diarrhea, unspecified: Secondary | ICD-10-CM | POA: Insufficient documentation

## 2015-05-20 DIAGNOSIS — R112 Nausea with vomiting, unspecified: Secondary | ICD-10-CM | POA: Insufficient documentation

## 2015-05-20 DIAGNOSIS — Z3202 Encounter for pregnancy test, result negative: Secondary | ICD-10-CM | POA: Insufficient documentation

## 2015-05-20 LAB — URINALYSIS, ROUTINE W REFLEX MICROSCOPIC
Bilirubin Urine: NEGATIVE
Glucose, UA: NEGATIVE mg/dL
HGB URINE DIPSTICK: NEGATIVE
Ketones, ur: NEGATIVE mg/dL
Nitrite: NEGATIVE
Protein, ur: NEGATIVE mg/dL
Specific Gravity, Urine: 1.026 (ref 1.005–1.030)
pH: 7 (ref 5.0–8.0)

## 2015-05-20 LAB — URINE MICROSCOPIC-ADD ON

## 2015-05-20 LAB — RAPID STREP SCREEN (MED CTR MEBANE ONLY): STREPTOCOCCUS, GROUP A SCREEN (DIRECT): NEGATIVE

## 2015-05-20 LAB — POC URINE PREG, ED: PREG TEST UR: NEGATIVE

## 2015-05-20 MED ORDER — LOPERAMIDE HCL 2 MG PO CAPS: 2.0000 mg | ORAL_CAPSULE | ORAL | Status: DC | PRN

## 2015-05-20 MED ORDER — LOPERAMIDE HCL 2 MG PO CAPS
4.0000 mg | ORAL_CAPSULE | Freq: Once | ORAL | Status: AC
  Administered 2015-05-20: 4 mg via ORAL
  Filled 2015-05-20: qty 2

## 2015-05-20 MED ORDER — ONDANSETRON 4 MG PO TBDP
4.0000 mg | ORAL_TABLET | Freq: Once | ORAL | Status: AC | PRN
  Administered 2015-05-20: 4 mg via ORAL

## 2015-05-20 MED ORDER — ONDANSETRON 4 MG PO TBDP: 4.0000 mg | ORAL_TABLET | Freq: Three times a day (TID) | ORAL | Status: DC | PRN

## 2015-05-20 MED ORDER — ONDANSETRON 4 MG PO TBDP
ORAL_TABLET | ORAL | Status: AC
  Filled 2015-05-20: qty 1

## 2015-05-20 MED ORDER — ONDANSETRON 4 MG PO TBDP
4.0000 mg | ORAL_TABLET | Freq: Once | ORAL | Status: AC
  Administered 2015-05-20: 4 mg via ORAL
  Filled 2015-05-20: qty 1

## 2015-05-20 NOTE — ED Provider Notes (Signed)
CSN: [id number]     Arrival date & time 05/20/15  0402 History   First MD Initiated Contact with Patient 05/20/15 0430     Chief Complaint  Patient presents with  . Emesis  . Diarrhea     (Consider location/radiation/quality/duration/timing/severity/associated sxs/prior Treatment) HPI  This a 20 year old female who presents with vomiting and diarrhea. Onset of symptoms 2 hours prior to arrival. Recently reports sore throat. No fever. Denies any abdominal pain. Denies any bile or blood in her emesis. She's had multiple episodes of vomiting and one episode of diarrhea. No known sick contacts. She feels better after Zofran. She is requesting strep screening for her sore throat.  LMP January 16.  Past Medical History  Diagnosis Date  . Vaginosis   . Seizures (HCC)     x1, after taking flagyl  . Shortness of breath   . Headache(784.0)   . Infection     UTI, yeast   Past Surgical History  Procedure Laterality Date  . Appendectomy  2008   No family history on file. Social History  Substance Use Topics  . Smoking status: Former Smoker    Types: Cigarettes  . Smokeless tobacco: Never Used  . Alcohol Use: Yes     Comment: last Saturday   OB History    Gravida Para Term Preterm AB TAB SAB Ectopic Multiple Living   0              Review of Systems  Constitutional: Negative for fever.  Respiratory: Negative for chest tightness and shortness of breath.   Cardiovascular: Negative for chest pain.  Gastrointestinal: Positive for nausea, vomiting and diarrhea. Negative for abdominal pain.  Genitourinary: Negative for dysuria.  Neurological: Negative for headaches.  All other systems reviewed and are negative.     Allergies  Flagyl  Home Medications   Prior to Admission medications   Medication Sig Start Date End Date Taking? Authorizing Provider  ALPRAZolam (XANAX) 0.25 MG tablet Take 0.25 mg by mouth 2 (two) times daily as needed for anxiety.    Historical Provider, MD   cetirizine (ZYRTEC) 10 MG tablet Take 1 tablet (10 mg total) by mouth daily. 01/21/15   Judeth Horn, NP  ibuprofen (ADVIL,MOTRIN) 600 MG tablet Take 1 tablet (600 mg total) by mouth every 6 (six) hours as needed. 01/21/15   Judeth Horn, NP  levETIRAcetam (KEPPRA) 500 MG tablet Take 1 tablet (500 mg total) by mouth 2 (two) times daily. 08/03/14   Tilden Fossa, MD  loperamide (IMODIUM) 2 MG capsule Take 1 capsule (2 mg total) by mouth as needed for diarrhea or loose stools. 05/20/15   Shon Baton, MD  Multiple Vitamin (MULTIVITAMIN WITH MINERALS) TABS tablet Take 1 tablet by mouth daily.    Historical Provider, MD  ondansetron (ZOFRAN-ODT) 4 MG disintegrating tablet Take 1 tablet (4 mg total) by mouth every 8 (eight) hours as needed for nausea. 05/20/15   Shon Baton, MD  promethazine (PHENERGAN) 12.5 MG tablet Take 1 tablet (12.5 mg total) by mouth every 6 (six) hours as needed for nausea or vomiting. 01/21/15   Judeth Horn, NP   BP 133/93 mmHg  Pulse 105  Temp(Src) 97.8 F (36.6 C) (Oral)  Resp 20  Ht  (1.676 m)  Wt 128 lb (58.06 kg)  BMI 20.67 kg/m2  SpO2 100%  LMP 05/06/2015 Physical Exam  Constitutional: She is oriented to person, place, and time. She appears well-developed and well-nourished. No distress.  HENT:  Head:  Normocephalic and atraumatic.  Mucous membranes moist, mild erythematous posterior oropharynx, no tonsillar exudate, palatal petechiae present  Cardiovascular: Normal rate, regular rhythm and normal heart sounds.   No murmur heard. Pulmonary/Chest: Effort normal and breath sounds normal. No respiratory distress. She has no wheezes.  Abdominal: Soft. Bowel sounds are normal. There is no tenderness. There is no rebound.  Neurological: She is alert and oriented to person, place, and time.  Skin: Skin is warm and dry.  Psychiatric: She has a normal mood and affect.  Nursing note and vitals reviewed.   ED Course  Procedures (including critical care  time) Labs Review Labs Reviewed  URINALYSIS, ROUTINE W REFLEX MICROSCOPIC (NOT AT [facility]) - Abnormal; Notable for the following:    APPearance CLOUDY (*)    Leukocytes, UA MODERATE (*)    All other components within normal limits  URINE MICROSCOPIC-ADD ON - Abnormal; Notable for the following:    Squamous Epithelial / LPF 6-30 (*)    Bacteria, UA RARE (*)    All other components within normal limits  RAPID STREP SCREEN (NOT AT [facility])  CULTURE, GROUP A STREP ([facility])  POC URINE PREG, ED    Imaging Review No results found. I have personally reviewed and evaluated these images and lab results as part of my medical decision-making.   EKG Interpretation None      MDM   Final diagnoses:  Sore throat  Nausea vomiting and diarrhea    Patient presents with vomiting and diarrhea. Also recent history of sore throat. She is nontoxic on exam. Vital signs are reassuring. She reports improvement with Zofran. Urinalysis is negative for pregnancy. No obvious UTI and no ketones. She is able to orally hydrate. Discussed with patient supportive care at home with Zofran and Imodium. Of note, her strep screen is negative.  After history, exam, and medical workup I feel the patient has been appropriately medically screened and is safe for discharge home. Pertinent diagnoses were discussed with the patient. Patient was given return precautions.     Shon Baton, MD 05/20/15 [phone]

## 2015-05-20 NOTE — ED Notes (Signed)
This EMT tried to obtain a urine sample. Pt refused at this time, will try again later

## 2015-05-20 NOTE — Discharge Instructions (Signed)

## 2015-05-20 NOTE — ED Notes (Signed)
Pt having vomiting and diarrhea since 0300. Has vomited 5 times since 0300 and 1 episode of diarrhea. Denies any abd pain.

## 2015-05-22 LAB — CULTURE, GROUP A STREP (THRC)

## 2015-08-08 ENCOUNTER — Encounter (HOSPITAL_COMMUNITY): Payer: Self-pay | Admitting: *Deleted

## 2015-08-08 DIAGNOSIS — Z9049 Acquired absence of other specified parts of digestive tract: Secondary | ICD-10-CM | POA: Insufficient documentation

## 2015-08-08 DIAGNOSIS — Z3202 Encounter for pregnancy test, result negative: Secondary | ICD-10-CM | POA: Insufficient documentation

## 2015-08-08 DIAGNOSIS — Z87440 Personal history of urinary (tract) infections: Secondary | ICD-10-CM | POA: Insufficient documentation

## 2015-08-08 DIAGNOSIS — B373 Candidiasis of vulva and vagina: Secondary | ICD-10-CM | POA: Insufficient documentation

## 2015-08-08 DIAGNOSIS — R109 Unspecified abdominal pain: Secondary | ICD-10-CM | POA: Insufficient documentation

## 2015-08-08 DIAGNOSIS — Z87891 Personal history of nicotine dependence: Secondary | ICD-10-CM | POA: Insufficient documentation

## 2015-08-08 LAB — COMPREHENSIVE METABOLIC PANEL
ALT: 14 U/L (ref 14–54)
ANION GAP: 10 (ref 5–15)
AST: 22 U/L (ref 15–41)
Albumin: 3.9 g/dL (ref 3.5–5.0)
Alkaline Phosphatase: 50 U/L (ref 38–126)
BUN: 16 mg/dL (ref 6–20)
CALCIUM: 9.8 mg/dL (ref 8.9–10.3)
CHLORIDE: 105 mmol/L (ref 101–111)
CO2: 24 mmol/L (ref 22–32)
CREATININE: 0.94 mg/dL (ref 0.44–1.00)
GFR calc Af Amer: >60 mL/min (ref >60)
Glucose, Bld: 83 mg/dL (ref 65–99)
Potassium: 3.8 mmol/L (ref 3.5–5.1)
Sodium: 139 mmol/L (ref 135–145)
Total Bilirubin: 0.4 mg/dL (ref 0.3–1.2)
Total Protein: 7.7 g/dL (ref 6.5–8.1)

## 2015-08-08 LAB — POC URINE PREG, ED: PREG TEST UR: NEGATIVE

## 2015-08-08 LAB — LIPASE, BLOOD: Lipase: 25 U/L (ref 11–51)

## 2015-08-08 LAB — URINALYSIS, ROUTINE W REFLEX MICROSCOPIC
Bilirubin Urine: NEGATIVE
Glucose, UA: NEGATIVE mg/dL
Ketones, ur: NEGATIVE mg/dL
Nitrite: NEGATIVE
PROTEIN: NEGATIVE mg/dL
Specific Gravity, Urine: 1.025 (ref 1.005–1.030)
pH: 6.5 (ref 5.0–8.0)

## 2015-08-08 LAB — CBC
HCT: 40.3 % (ref 36.0–46.0)
HEMOGLOBIN: 13.8 g/dL (ref 12.0–15.0)
MCH: 30.1 pg (ref 26.0–34.0)
MCHC: 34.2 g/dL (ref 30.0–36.0)
MCV: 87.8 fL (ref 78.0–100.0)
PLATELETS: 230 10*3/uL (ref 150–400)
RBC: 4.59 MIL/uL (ref 3.87–5.11)
RDW: 12.1 % (ref 11.5–15.5)
WBC: 5.4 10*3/uL (ref 4.0–10.5)

## 2015-08-08 LAB — URINE MICROSCOPIC-ADD ON

## 2015-08-08 NOTE — ED Notes (Signed)
The pt is c/o abd cramps for 2 months no nv  lmp April 12th

## 2015-08-09 ENCOUNTER — Emergency Department (HOSPITAL_COMMUNITY)
Admission: EM | Admit: 2015-08-09 | Discharge: 2015-08-09 | Disposition: A | Discharge status: Home / Self Care | Payer: BLUE CROSS/BLUE SHIELD | Attending: Emergency Medicine | Admitting: Emergency Medicine

## 2015-08-09 DIAGNOSIS — R109 Unspecified abdominal pain: Secondary | ICD-10-CM

## 2015-08-09 DIAGNOSIS — B3731 Acute candidiasis of vulva and vagina: Secondary | ICD-10-CM

## 2015-08-09 DIAGNOSIS — B373 Candidiasis of vulva and vagina: Secondary | ICD-10-CM

## 2015-08-09 LAB — WET PREP, GENITAL
Clue Cells Wet Prep HPF POC: NONE SEEN
SPERM: NONE SEEN
Trich, Wet Prep: NONE SEEN

## 2015-08-09 MED ORDER — LIDOCAINE HCL (PF) 1 % IJ SOLN
5.0000 mL | Freq: Once | INTRAMUSCULAR | Status: AC
  Administered 2015-08-09: 5 mL
  Filled 2015-08-09: qty 5

## 2015-08-09 MED ORDER — ONDANSETRON 4 MG PO TBDP
4.0000 mg | ORAL_TABLET | Freq: Once | ORAL | Status: AC
  Administered 2015-08-09: 4 mg via ORAL
  Filled 2015-08-09: qty 1

## 2015-08-09 MED ORDER — CEFTRIAXONE SODIUM 250 MG IJ SOLR
250.0000 mg | Freq: Once | INTRAMUSCULAR | Status: AC
  Administered 2015-08-09: 250 mg via INTRAMUSCULAR
  Filled 2015-08-09: qty 250

## 2015-08-09 MED ORDER — FLUCONAZOLE 100 MG PO TABS
150.0000 mg | ORAL_TABLET | Freq: Once | ORAL | Status: AC
  Administered 2015-08-09: 150 mg via ORAL
  Filled 2015-08-09: qty 2

## 2015-08-09 MED ORDER — AZITHROMYCIN 250 MG PO TABS
1000.0000 mg | ORAL_TABLET | Freq: Once | ORAL | Status: AC
  Administered 2015-08-09: 1000 mg via ORAL
  Filled 2015-08-09: qty 4

## 2015-08-09 NOTE — ED Notes (Signed)
No med reaction  Waiting for d/c papers

## 2015-08-09 NOTE — ED Notes (Signed)
Med given  Including a rocephin shot  The pt will need to wait  For 20 minutes top check for a reaction

## 2015-08-09 NOTE — ED Provider Notes (Signed)
CSN: [id number]     Arrival date & time 08/08/15  2127 History  By signing my name below, I, Theresa Marshall, attest that this documentation has been prepared under the direction and in the presence of Theresa Spatesachel Morgan Caydan Mctavish, MD . Electronically Signed: Freida Busmaniana Marshall, Scribe. 08/09/2015. 2:28 AM.    Chief Complaint  Patient presents with  . Abdominal Pain    The history is provided by the patient. No language interpreter was used.     HPI Comments:  Theresa Marshall is a 20 y.o. female who presents to the Emergency Department complaining of abdominal cramps x 2 months with daily episodes for the last 2 weeks. [facility] pregnancy test ~ 1 month ago, both of which were negative.Pt reports recent recurrent yeast infections;  reports associated vaginal discharge at this time. She also notes mild rectal pressure with the cramping She denies abnormal vaginal bleeding, vomiting, diarrhea, constipation, blood in stool, fever and dysuria. Pt is sexually active with 1 partner; denies condom use and use of any other birth control. No alleviating factors noted; no treatments tried.   Past Medical History  Diagnosis Date  . Vaginosis   . Seizures (HCC)     x1, after taking flagyl  . Shortness of breath   . Headache(784.0)   . Infection     UTI, yeast   Past Surgical History  Procedure Laterality Date  . Appendectomy  2008   No family history on file. Social History  Substance Use Topics  . Smoking status: Former Smoker    Types: Cigarettes  . Smokeless tobacco: Never Used  . Alcohol Use: Yes     Comment: last Saturday   OB History    Gravida Para Term Preterm AB TAB SAB Ectopic Multiple Living   0              Review of Systems 10 systems reviewed and all are negative for acute change except as noted in the HPI.   Allergies  Flagyl  Home Medications   Prior to Admission medications   Medication Sig Start Date End  Date Taking? Authorizing Provider  cetirizine (ZYRTEC) 10 MG tablet Take 1 tablet (10 mg total) by mouth daily. Patient not taking: Reported on 08/09/2015 01/21/15   Judeth HornErin Lawrence, NP  ibuprofen (ADVIL,MOTRIN) 600 MG tablet Take 1 tablet (600 mg total) by mouth every 6 (six) hours as needed. Patient not taking: Reported on 08/09/2015 01/21/15   Judeth HornErin Lawrence, NP  levETIRAcetam (KEPPRA) 500 MG tablet Take 1 tablet (500 mg total) by mouth 2 (two) times daily. Patient not taking: Reported on 08/09/2015 08/03/14   Tilden FossaElizabeth Rees, MD  loperamide (IMODIUM) 2 MG capsule Take 1 capsule (2 mg total) by mouth as needed for diarrhea or loose stools. Patient not taking: Reported on 08/09/2015 05/20/15   Shon Batonourtney F Horton, MD  ondansetron (ZOFRAN-ODT) 4 MG disintegrating tablet Take 1 tablet (4 mg total) by mouth every 8 (eight) hours as needed for nausea. Patient not taking: Reported on 08/09/2015 05/20/15   Shon Batonourtney F Horton, MD  promethazine (PHENERGAN) 12.5 MG tablet Take 1 tablet (12.5 mg total) by mouth every 6 (six) hours as needed for nausea or vomiting. Patient not taking: Reported on 08/09/2015 01/21/15   Judeth HornErin Lawrence, NP   BP 118/77 mmHg  Pulse 62  Temp(Src) 97.5 F (36.4 C) (Oral)  Resp 18  Ht 5\' 5"  (1.651 m)  Wt 135 lb  2 oz (61.292 kg)  BMI 22.49 kg/m2  SpO2 99%  LMP 07/29/2015 Physical Exam  Constitutional: She is oriented to person, place, and time. She appears well-developed and well-nourished. No distress.  HENT:  Head: Normocephalic and atraumatic.  Moist mucous membranes  Eyes: Conjunctivae are normal. Pupils are equal, round, and reactive to light.  Neck: Neck supple.  Cardiovascular: Normal rate, regular rhythm and normal heart sounds.   No murmur heard. Pulmonary/Chest: Effort normal and breath sounds normal.  Abdominal: Soft. Bowel sounds are normal. She exhibits no distension. There is no tenderness.  Genitourinary: Vaginal discharge found.  White vaginal d/c in vault w/ mild cervix  irritation; no cervical motion or adnexal tenderness  Chaperone was present for exam which was performed with no discomfort or complications.   Musculoskeletal: She exhibits no edema.  Neurological: She is alert and oriented to person, place, and time.  Fluent speech  Skin: Skin is warm and dry.  Psychiatric: She has a normal mood and affect. Judgment normal.  Nursing note and vitals reviewed.   ED Course  Procedures   DIAGNOSTIC STUDIES:  Oxygen Saturation is 97% on RA, normal by my interpretation.    COORDINATION OF CARE:  12:58 AM Discussed treatment plan with pt at bedside and pt agreed to plan.  Labs Review Labs Reviewed  WET PREP, GENITAL - Abnormal; Notable for the following:    Yeast Wet Prep HPF POC PRESENT (*)    WBC, Wet Prep HPF POC MANY (*)    All other components within normal limits  URINALYSIS, ROUTINE W REFLEX MICROSCOPIC (NOT AT [facility]) - Abnormal; Notable for the following:    Hgb urine dipstick TRACE (*)    Leukocytes, UA MODERATE (*)    All other components within normal limits  URINE MICROSCOPIC-ADD ON - Abnormal; Notable for the following:    Squamous Epithelial / LPF 0-5 (*)    Bacteria, UA FEW (*)    All other components within normal limits  LIPASE, BLOOD  COMPREHENSIVE METABOLIC PANEL  CBC  POC URINE PREG, ED  GC/CHLAMYDIA PROBE AMP ([place]) NOT AT [facility]    Imaging Review No results found. I have personally reviewed and evaluated these lab results as part of my medical decision-making.  Medications  cefTRIAXone (ROCEPHIN) injection 250 mg (250 mg Intramuscular Given 08/09/15 0328)  ondansetron (ZOFRAN-ODT) disintegrating tablet 4 mg (4 mg Oral Given 08/09/15 0329)  azithromycin (ZITHROMAX) tablet 1,000 mg (1,000 mg Oral Given 08/09/15 0330)  fluconazole (DIFLUCAN) tablet 150 mg (150 mg Oral Given 08/09/15 0329)  lidocaine (PF) (XYLOCAINE) 1 % injection 5 mL (5 mLs Other Given 08/09/15 0328)     MDM   Final diagnoses:  Vaginal yeast  infection  Abdominal pain, unspecified abdominal location   Pt with 2 months of intermittent abdominal cramping as well as intermittent vaginal discharge. She was treated a few weeks ago for yeast infection and previously tested negative for STD or pregnancy. On exam, she was well-appearing with normal vital signs. No abdominal tenderness on exam. Pelvic exam showed moderate white vaginal discharge in vault, no cervical motion or adnexal tenderness to suggest PID but some cervix irritation. Discussed empiric treatment and patient agreed to STD treatment with ceftriaxone and azithromycin as well as Diflucan. Her lab work here is unremarkable, some WBCs and bacteria in urine but she denies any urinary symptoms.  I discussed supportive care instructions including NSAIDs and I extensively reviewed return precautions including fever, worsening pain, or any new symptoms. Patient  voiced understanding and was discharged in satisfactory condition.  I personally performed the services described in this documentation, which was scribed in my presence. The recorded information has been reviewed and is accurate.    Theresa Spates, MD 08/10/15 [phone]

## 2015-08-10 LAB — GC/CHLAMYDIA PROBE AMP (~~LOC~~) NOT AT ARMC
Chlamydia: NEGATIVE
Neisseria Gonorrhea: NEGATIVE

## 2016-04-24 ENCOUNTER — Ambulatory Visit (HOSPITAL_COMMUNITY)
Admission: EM | Admit: 2016-04-24 | Discharge: 2016-04-24 | Disposition: A | Discharge status: Home / Self Care | Payer: BLUE CROSS/BLUE SHIELD | Attending: Family Medicine | Admitting: Family Medicine

## 2016-04-24 ENCOUNTER — Encounter (HOSPITAL_COMMUNITY): Payer: Self-pay

## 2016-04-24 DIAGNOSIS — R05 Cough: Secondary | ICD-10-CM

## 2016-04-24 DIAGNOSIS — J029 Acute pharyngitis, unspecified: Secondary | ICD-10-CM

## 2016-04-24 DIAGNOSIS — R059 Cough, unspecified: Secondary | ICD-10-CM

## 2016-04-24 DIAGNOSIS — J020 Streptococcal pharyngitis: Secondary | ICD-10-CM

## 2016-04-24 MED ORDER — BENZONATATE 100 MG PO CAPS: 200.0000 mg | ORAL_CAPSULE | Freq: Three times a day (TID) | ORAL | 0 refills | Status: DC | PRN

## 2016-04-24 MED ORDER — LIDOCAINE VISCOUS 2 % MT SOLN: 20.0000 mL | OROMUCOSAL | 0 refills | Status: DC | PRN

## 2016-04-24 MED ORDER — CLARITHROMYCIN 500 MG PO TABS: 500.0000 mg | ORAL_TABLET | Freq: Two times a day (BID) | ORAL | 0 refills | Status: DC

## 2016-04-24 NOTE — ED Provider Notes (Signed)
CSN: [id number]     Arrival date & time 04/24/16  1410 History   None    Chief Complaint  Patient presents with  . Sore Throat   (Consider location/radiation/quality/duration/timing/severity/associated sxs/prior Treatment) Patietn c/o sore throat cough and uri sx's for over a week.  Patient was called and told she has strep and she finished amoxicillin and it didn't help.  She is coughing and she is haivng fever.   The history is provided by the patient.  Sore Throat  This is a new problem. The current episode started more than 1 week ago. The problem occurs constantly. The problem has not changed since onset.The symptoms are aggravated by swallowing, sneezing, standing, exertion and walking. Nothing relieves the symptoms. She has tried acetaminophen for the symptoms.    Past Medical History:  Diagnosis Date  . Headache(784.0)   . Infection    UTI, yeast  . Seizures (HCC)    x1, after taking flagyl  . Shortness of breath   . Vaginosis    Past Surgical History:  Procedure Laterality Date  . APPENDECTOMY  2008   No family history on file. Social History  Substance Use Topics  . Smoking status: Former Smoker    Types: Cigarettes  . Smokeless tobacco: Never Used  . Alcohol use Yes     Comment: last Saturday   OB History    Gravida Para Term Preterm AB Living   0             SAB TAB Ectopic Multiple Live Births                 Review of Systems  Constitutional: Positive for fever.  HENT: Positive for sore throat.   Eyes: Negative.   Respiratory: Positive for cough.   Cardiovascular: Negative.   Endocrine: Negative.   Genitourinary: Negative.     Allergies  Flagyl [metronidazole]  Home Medications   Prior to Admission medications   Medication Sig Start Date End Date Taking? Authorizing Provider  cetirizine (ZYRTEC) 10 MG tablet Take 1 tablet (10 mg total) by mouth daily. 01/21/15  Yes Judeth Horn, NP  benzonatate (TESSALON) 100 MG capsule Take 2 capsules (200  mg total) by mouth 3 (three) times daily as needed for cough. 04/24/16   Deatra Canter, FNP  clarithromycin (BIAXIN) 500 MG tablet Take 1 tablet (500 mg total) by mouth 2 (two) times daily. 04/24/16   Deatra Canter, FNP  ibuprofen (ADVIL,MOTRIN) 600 MG tablet Take 1 tablet (600 mg total) by mouth every 6 (six) hours as needed. Patient not taking: Reported on 08/09/2015 01/21/15   Judeth Horn, NP  levETIRAcetam (KEPPRA) 500 MG tablet Take 1 tablet (500 mg total) by mouth 2 (two) times daily. Patient not taking: Reported on 08/09/2015 08/03/14   Tilden Fossa, MD  lidocaine (XYLOCAINE) 2 % solution Use as directed 20 mLs in the mouth or throat as needed for mouth pain. 04/24/16   Deatra Canter, FNP  loperamide (IMODIUM) 2 MG capsule Take 1 capsule (2 mg total) by mouth as needed for diarrhea or loose stools. Patient not taking: Reported on 08/09/2015 05/20/15   Shon Baton, MD  ondansetron (ZOFRAN-ODT) 4 MG disintegrating tablet Take 1 tablet (4 mg total) by mouth every 8 (eight) hours as needed for nausea. Patient not taking: Reported on 08/09/2015 05/20/15   Shon Baton, MD  promethazine (PHENERGAN) 12.5 MG tablet Take 1 tablet (12.5 mg total) by mouth every 6 (six) hours as  needed for nausea or vomiting. Patient not taking: Reported on 08/09/2015 01/21/15   Judeth HornErin Lawrence, NP   Meds Ordered and Administered this Visit  Medications - No data to display  BP 116/80 (BP Location: Left Arm)   Pulse 98   Temp 98.6 F (37 C) (Oral)   Resp 16   LMP 03/31/2016 (Exact Date)   SpO2 100%  No data found.   Physical Exam  Constitutional: She appears well-developed and well-nourished.  HENT:  Head: Normocephalic and atraumatic.  Right Ear: External ear normal.  Left Ear: External ear normal.  Nose: Nose normal.  opx injected  Eyes: Conjunctivae and EOM are normal. Pupils are equal, round, and reactive to light.  Neck: Normal range of motion. Neck supple.  Cardiovascular: Normal rate,  regular rhythm and normal heart sounds.   Pulmonary/Chest: Effort normal and breath sounds normal.  Abdominal: Soft. Bowel sounds are normal.  Nursing note and vitals reviewed.   Urgent Care Course   Clinical Course     Procedures (including critical care time)  Labs Review Labs Reviewed - No data to display  Imaging Review No results found.   Visual Acuity Review  Right Eye Distance:   Left Eye Distance:   Bilateral Distance:    Right Eye Near:   Left Eye Near:    Bilateral Near:         MDM   1. Strep throat   2. Pharyngitis due to Streptococcus species   3. Sore throat   4. Cough    Biaxin 500mg  one po bid x 7 days #14 Lidocaine solution 20ml po qidrpn #15900ml Tessalon perles Push po fluids, rest, tylenol and motrin otc prn as directed for fever, arthralgias, and myalgias.  Follow up prn if sx's continue or persist.    Deatra CanterWilliam J Nilton Lave, FNP 04/24/16 1553

## 2016-04-24 NOTE — ED Triage Notes (Signed)
Pt was positive for strep for a little over a week and was given amoxicillin but said it isn't helping. Sore throat, cough, chest pain, fever of 101 this morning when she checked. Last took the amoxicillin yesterday.

## 2016-09-09 ENCOUNTER — Inpatient Hospital Stay (HOSPITAL_COMMUNITY)
Admission: AD | Admit: 2016-09-09 | Discharge: 2016-09-09 | Disposition: A | Discharge status: Home / Self Care | Payer: BLUE CROSS/BLUE SHIELD | Source: Ambulatory Visit | Attending: Obstetrics & Gynecology | Admitting: Obstetrics & Gynecology

## 2016-09-09 ENCOUNTER — Encounter (HOSPITAL_COMMUNITY): Payer: Self-pay | Admitting: *Deleted

## 2016-09-09 DIAGNOSIS — R109 Unspecified abdominal pain: Secondary | ICD-10-CM | POA: Diagnosis not present

## 2016-09-09 DIAGNOSIS — O209 Hemorrhage in early pregnancy, unspecified: Secondary | ICD-10-CM

## 2016-09-09 DIAGNOSIS — N939 Abnormal uterine and vaginal bleeding, unspecified: Secondary | ICD-10-CM | POA: Insufficient documentation

## 2016-09-09 DIAGNOSIS — Z87891 Personal history of nicotine dependence: Secondary | ICD-10-CM | POA: Diagnosis not present

## 2016-09-09 DIAGNOSIS — Z3202 Encounter for pregnancy test, result negative: Secondary | ICD-10-CM | POA: Insufficient documentation

## 2016-09-09 LAB — HCG, QUANTITATIVE, PREGNANCY: hCG, Beta Chain, Quant, S: 1 m[IU]/mL (ref <5)

## 2016-09-09 LAB — CBC WITH DIFFERENTIAL/PLATELET
BASOS ABS: 0.1 10*3/uL (ref 0.0–0.1)
Basophils Relative: 1 %
Eosinophils Absolute: 0.1 10*3/uL (ref 0.0–0.7)
Eosinophils Relative: 1 %
HEMATOCRIT: 38.1 % (ref 36.0–46.0)
Hemoglobin: 13.2 g/dL (ref 12.0–15.0)
LYMPHS PCT: 49 %
Lymphs Abs: 2.6 10*3/uL (ref 0.7–4.0)
MCH: 31 pg (ref 26.0–34.0)
MCHC: 34.6 g/dL (ref 30.0–36.0)
MCV: 89.4 fL (ref 78.0–100.0)
MONO ABS: 0.2 10*3/uL (ref 0.1–1.0)
Monocytes Relative: 3 %
NEUTROS PCT: 46 %
Neutro Abs: 2.6 10*3/uL (ref 1.7–7.7)
Platelets: 247 10*3/uL (ref 150–400)
RBC: 4.26 MIL/uL (ref 3.87–5.11)
RDW: 12.4 % (ref 11.5–15.5)
WBC: 5.6 10*3/uL (ref 4.0–10.5)

## 2016-09-09 LAB — URINALYSIS, ROUTINE W REFLEX MICROSCOPIC
BACTERIA UA: NONE SEEN
Bilirubin Urine: NEGATIVE
Glucose, UA: NEGATIVE mg/dL
Ketones, ur: NEGATIVE mg/dL
Leukocytes, UA: NEGATIVE
Nitrite: NEGATIVE
Protein, ur: NEGATIVE mg/dL
SPECIFIC GRAVITY, URINE: 1.015 (ref 1.005–1.030)
pH: 7 (ref 5.0–8.0)

## 2016-09-09 LAB — ABO/RH: ABO/RH(D): O POS

## 2016-09-09 LAB — POCT PREGNANCY, URINE: PREG TEST UR: NEGATIVE

## 2016-09-09 NOTE — Discharge Instructions (Signed)
Pregnancy Test Information °What is a pregnancy test? °A pregnancy test is used to detect the presence of human chorionic gonadotropin (hCG) in a sample of your urine or blood. hCG is a hormone produced by the cells of the placenta. The placenta is the organ that forms to nourish and support a developing baby. °This test requires a sample of either blood or urine. A pregnancy test determines whether you are pregnant or not. °How are pregnancy tests done? °Pregnancy tests are done using a home pregnancy test or having a blood or urine test done at your health care provider's office. °Home pregnancy tests require a urine sample. °· Most kits use a plastic testing device with a strip of paper that indicates whether there is hCG in your urine. °· Follow the test instructions very carefully. °· After you urinate on the test stick, markings will appear to let you know whether you are pregnant. °· For best results, use your first urine of the morning. That is when the concentration of hCG is highest. ° °Having a blood test to check for pregnancy requires a sample of blood drawn from a vein in your hand or arm. Your health care provider will send your sample to a lab for testing. Results of a pregnancy test will be positive or negative. °Is one type of pregnancy test better than another? °In some cases, a blood test will return a positive result even if a urine test was negative because blood tests are more sensitive. This means blood tests can detect hCG earlier than home pregnancy tests. °How accurate are home pregnancy tests? °Both types of pregnancy tests are very accurate. °· A blood test is about 98% accurate. °· When you are far enough along in your pregnancy and when used correctly, home pregnancy tests are equally accurate. ° °Can anything interfere with home pregnancy test results °It is possible for certain conditions to cause an inaccurate test result (false positive or false negative). °· A false positive is a  positive test result when you are not pregnant. This can happen if you: °? Are taking certain medicines, including anticonvulsants or tranquilizers. °? Have certain proteins in your blood. °· A false negative is a negative test result when you are pregnant. This can happen if you: °? Took the test before there was enough hCG to detect. A pregnancy test will not be positive in most women until 3-4 weeks after conception. °? Drank a lot of liquid before the test. Diluted urine samples can sometimes give an inaccurate result. °? Take certain medicines, such as water pills (diuretics) or some antihistamines. ° °What should I do if I have a positive pregnancy test? °If you have a positive pregnancy test, schedule an appointment with your health care provider. You might need additional testing to confirm the pregnancy. In the meantime, begin taking a prenatal vitamin, stop smoking, stop drinking alcohol, and do not use street drugs. °Talk to your health care provider about how to take care of yourself during your pregnancy. Ask about what to expect from the care you will need throughout pregnancy (prenatal care). °This information is not intended to replace advice given to you by your health care provider. Make sure you discuss any questions you have with your health care provider. °Document Released: 04/07/2003 Document Revised: 03/01/2016 Document Reviewed: 07/30/2013 °Elsevier Interactive Patient Education © 2017 [employer] ° °

## 2016-09-09 NOTE — MAU Note (Signed)
Did upt last wk and was negative. Took another few days later and was positive. This am had some brown spotting. This afternoon went to [facility] and had pink spotting with small clots. Went to [facility] and upt negative. Only see spotting when I wipe. Not on underwear. Mild cramping

## 2016-09-09 NOTE — MAU Provider Note (Signed)
Patient Theresa Marshall is a 21 year old G0 here with complaints of light spotting and abdominal pain since this morning. She took a home preg test and it was positive 4 days ago.  States that the pregnancy test was negative at 3 minutes but was positive at 10 minutes. After her spotting episode today, she went to [facility] urgent care, where her UPT was negative. [facility].  History     CSN: [id number]  Arrival date and time: 09/09/16 1859   First Provider Initiated Contact with Patient 09/09/16 1950      Chief Complaint  Patient presents with  . Abdominal Pain  . Vaginal Bleeding   Vaginal Bleeding  The patient's primary symptoms include vaginal bleeding. The patient's pertinent negatives include no genital itching, genital lesions or pelvic pain. This is a new problem. The current episode started today. The problem occurs intermittently. The problem has been unchanged. The pain is mild. The problem affects both sides. Associated symptoms include abdominal pain. Pertinent negatives include no back pain, constipation, diarrhea, hematuria, nausea, urgency or vomiting. The vaginal discharge was bloody. The vaginal bleeding is spotting. She has been passing clots. She has not been passing tissue. Nothing aggravates the symptoms. She has tried nothing for the symptoms. She uses nothing for contraception.    OB History    Gravida Para Term Preterm AB Living   0             SAB TAB Ectopic Multiple Live Births                  Past Medical History:  Diagnosis Date  . Headache(784.0)   . Infection    UTI, yeast  . Seizures (HCC)    x1, after taking flagyl  . Shortness of breath   . Vaginosis     Past Surgical History:  Procedure Laterality Date  . APPENDECTOMY  2008    History reviewed. No pertinent family history.  Social History  Substance Use Topics  . Smoking status: Former Smoker    Types: Cigarettes  . Smokeless tobacco: Never Used   . Alcohol use Yes     Comment: last Saturday    Allergies:  Allergies  Allergen Reactions  . Flagyl [Metronidazole] Other (See Comments)    seizures    Prescriptions Prior to Admission  Medication Sig Dispense Refill Last Dose  . benzonatate (TESSALON) 100 MG capsule Take 2 capsules (200 mg total) by mouth 3 (three) times daily as needed for cough. 21 capsule 0   . cetirizine (ZYRTEC) 10 MG tablet Take 1 tablet (10 mg total) by mouth daily. 30 tablet 0 Not Taking at Unknown time  . clarithromycin (BIAXIN) 500 MG tablet Take 1 tablet (500 mg total) by mouth 2 (two) times daily. 14 tablet 0   . ibuprofen (ADVIL,MOTRIN) 600 MG tablet Take 1 tablet (600 mg total) by mouth every 6 (six) hours as needed. (Patient not taking: Reported on 08/09/2015) 30 tablet 0 Not Taking at Unknown time  . levETIRAcetam (KEPPRA) 500 MG tablet Take 1 tablet (500 mg total) by mouth 2 (two) times daily. (Patient not taking: Reported on 08/09/2015) 30 tablet 0 Not Taking at Unknown time  . lidocaine (XYLOCAINE) 2 % solution Use as directed 20 mLs in the mouth or throat as needed for mouth pain. 100 mL 0   . loperamide (IMODIUM) 2 MG capsule Take 1 capsule (2 mg total) by mouth as  needed for diarrhea or loose stools. (Patient not taking: Reported on 08/09/2015) 30 capsule 0 Not Taking at Unknown time  . ondansetron (ZOFRAN-ODT) 4 MG disintegrating tablet Take 1 tablet (4 mg total) by mouth every 8 (eight) hours as needed for nausea. (Patient not taking: Reported on 08/09/2015) 20 tablet 0 Not Taking at Unknown time  . promethazine (PHENERGAN) 12.5 MG tablet Take 1 tablet (12.5 mg total) by mouth every 6 (six) hours as needed for nausea or vomiting. (Patient not taking: Reported on 08/09/2015) 15 tablet 0 Not Taking at Unknown time    Review of Systems  Cardiovascular: Negative.   Gastrointestinal: Positive for abdominal pain. Negative for constipation, diarrhea, nausea and vomiting.  Genitourinary: Positive for vaginal  bleeding. Negative for hematuria, pelvic pain and urgency.  Musculoskeletal: Negative for back pain.  Neurological: Negative.   Denies dysuria, abnormal discharge, pain during intercourse.   Physical Exam   Blood pressure 131/81, pulse 84, temperature 97.3 F (36.3 C), resp. rate 18, height 5\' 6"  (1.676 m), weight 122 lb (55.3 kg), last menstrual period 08/15/2016.  Physical Exam  Constitutional: She is oriented to person, place, and time. She appears well-developed.  HENT:  Head: Normocephalic.  Neck: Normal range of motion.  Respiratory: Effort normal.  GI: Soft. She exhibits no distension and no mass. There is no tenderness. There is no rebound and no guarding.  Genitourinary: Vaginal discharge found.  Genitourinary Comments: NEFG; no lesions on vaginal walls. Small amount of bright red vault in the vaginal vault, some bright red blood and mucous extruding from the cervix. No CMT, no suprapubic or adnexal tenderness.   Musculoskeletal: Normal range of motion.  Neurological: She is alert and oriented to person, place, and time. She has normal reflexes.  Skin: Skin is warm and dry.    MAU Course  Procedures  MDM -bhcg-1 -UPT-negative   Assessment and Plan   1. Pregnancy examination or test, negative result    2. Patient beta hcg indicates not currently pregnant and unlikely was pregnant in the recent past.  3. Bleeding most likely due to start of menses.  4. Patient stable for discharge; patient desires to become pregnant so recommended well-woman exam and start taking prenatal vitamins. Explained the importance of following the directions for pregnancy test in order to avoid false positives in the future. All questions answered.   Charlesetta Garibaldi Huel Centola CNM 09/09/2016, 9:03 PM

## 2017-07-09 ENCOUNTER — Encounter (HOSPITAL_COMMUNITY): Payer: Self-pay

## 2017-07-09 ENCOUNTER — Inpatient Hospital Stay (HOSPITAL_COMMUNITY)
Admission: AD | Admit: 2017-07-09 | Discharge: 2017-07-09 | Disposition: A | Discharge status: Home / Self Care | Payer: Managed Care, Other (non HMO) | Source: Ambulatory Visit | Attending: Obstetrics and Gynecology | Admitting: Obstetrics and Gynecology

## 2017-07-09 DIAGNOSIS — M549 Dorsalgia, unspecified: Secondary | ICD-10-CM | POA: Insufficient documentation

## 2017-07-09 DIAGNOSIS — Z91018 Allergy to other foods: Secondary | ICD-10-CM | POA: Insufficient documentation

## 2017-07-09 DIAGNOSIS — R1032 Left lower quadrant pain: Secondary | ICD-10-CM | POA: Diagnosis not present

## 2017-07-09 DIAGNOSIS — N898 Other specified noninflammatory disorders of vagina: Secondary | ICD-10-CM | POA: Diagnosis not present

## 2017-07-09 DIAGNOSIS — K5900 Constipation, unspecified: Secondary | ICD-10-CM | POA: Diagnosis not present

## 2017-07-09 DIAGNOSIS — Z9889 Other specified postprocedural states: Secondary | ICD-10-CM | POA: Insufficient documentation

## 2017-07-09 DIAGNOSIS — Z87891 Personal history of nicotine dependence: Secondary | ICD-10-CM | POA: Insufficient documentation

## 2017-07-09 DIAGNOSIS — Z79899 Other long term (current) drug therapy: Secondary | ICD-10-CM | POA: Diagnosis not present

## 2017-07-09 DIAGNOSIS — Z881 Allergy status to other antibiotic agents status: Secondary | ICD-10-CM | POA: Diagnosis not present

## 2017-07-09 DIAGNOSIS — R109 Unspecified abdominal pain: Secondary | ICD-10-CM | POA: Insufficient documentation

## 2017-07-09 LAB — URINALYSIS, ROUTINE W REFLEX MICROSCOPIC
Bilirubin Urine: NEGATIVE
Glucose, UA: NEGATIVE mg/dL
Hgb urine dipstick: NEGATIVE
Ketones, ur: NEGATIVE mg/dL
LEUKOCYTES UA: NEGATIVE
Nitrite: NEGATIVE
PROTEIN: NEGATIVE mg/dL
pH: 5.5 (ref 5.0–8.0)

## 2017-07-09 LAB — CBC
HCT: 37.6 % (ref 36.0–46.0)
Hemoglobin: 13.2 g/dL (ref 12.0–15.0)
MCH: 31.6 pg (ref 26.0–34.0)
MCHC: 35.1 g/dL (ref 30.0–36.0)
MCV: 90 fL (ref 78.0–100.0)
Platelets: 251 10*3/uL (ref 150–400)
RBC: 4.18 MIL/uL (ref 3.87–5.11)
RDW: 12.2 % (ref 11.5–15.5)
WBC: 3.9 10*3/uL — AB (ref 4.0–10.5)

## 2017-07-09 LAB — POCT PREGNANCY, URINE: Preg Test, Ur: NEGATIVE

## 2017-07-09 LAB — WET PREP, GENITAL
CLUE CELLS WET PREP: NONE SEEN
Sperm: NONE SEEN
TRICH WET PREP: NONE SEEN
Yeast Wet Prep HPF POC: NONE SEEN

## 2017-07-09 NOTE — Progress Notes (Signed)
Patient just got [facility] on [facility]  Does not think she has a STI.

## 2017-07-09 NOTE — Discharge Instructions (Signed)
Abdominal Pain, Adult °Abdominal pain can be caused by many things. Often, abdominal pain is not serious and it gets better with no treatment or by being treated at home. However, sometimes abdominal pain is serious. Your health care provider will do a medical history and a physical exam to try to determine the cause of your abdominal pain. °Follow these instructions at home: °· Take over-the-counter and prescription medicines only as told by your health care provider. Do not take a laxative unless told by your health care provider. °· Drink enough fluid to keep your urine clear or pale yellow. °· Watch your condition for any changes. °· Keep all follow-up visits as told by your health care provider. This is important. °Contact a health care provider if: °· Your abdominal pain changes or gets worse. °· You are not hungry or you lose weight without trying. °· You are constipated or have diarrhea for more than 2-3 days. °· You have pain when you urinate or have a bowel movement. °· Your abdominal pain wakes you up at night. °· Your pain gets worse with meals, after eating, or with certain foods. °· You are throwing up and cannot keep anything down. °· You have a fever. °Get help right away if: °· Your pain does not go away as soon as your health care provider told you to expect. °· You cannot stop throwing up. °· Your pain is only in areas of the abdomen, such as the right side or the left lower portion of the abdomen. °· You have bloody or black stools, or stools that look like tar. °· You have severe pain, cramping, or bloating in your abdomen. °· You have signs of dehydration, such as: °? Dark urine, very little urine, or no urine. °? Cracked lips. °? Dry mouth. °? Sunken eyes. °? Sleepiness. °? Weakness. °This information is not intended to replace advice given to you by your health care provider. Make sure you discuss any questions you have with your health care provider. °Document Released: 01/12/2005 Document  Revised: 10/23/2015 Document Reviewed: 09/16/2015 °Elsevier Interactive Patient Education © 2018 [employer] ° °Constipation, Adult °Constipation is when a person: °· Poops (has a bowel movement) fewer times in a week than normal. °· Has a hard time pooping. °· Has poop that is dry, hard, or bigger than normal. ° °Follow these instructions at home: °Eating and drinking ° °· Eat foods that have a lot of fiber, such as: °? Fresh fruits and vegetables. °? Whole grains. °? Beans. °· Eat less of foods that are high in fat, low in fiber, or overly processed, such as: °? French fries. °? Hamburgers. °? Cookies. °? Candy. °? Soda. °· Drink enough fluid to keep your pee (urine) clear or pale yellow. °General instructions °· Exercise regularly or as told by your doctor. °· Go to the restroom when you feel like you need to poop. Do not hold it in. °· Take over-the-counter and prescription medicines only as told by your doctor. These include any fiber supplements. °· Do pelvic floor retraining exercises, such as: °? Doing deep breathing while relaxing your lower belly (abdomen). °? Relaxing your pelvic floor while pooping. °· Watch your condition for any changes. °· Keep all follow-up visits as told by your doctor. This is important. °Contact a doctor if: °· You have pain that gets worse. °· You have a fever. °· You have not pooped for 4 days. °· You throw up (vomit). °· You are not hungry. °·   You lose weight. °· You are bleeding from the anus. °· You have thin, pencil-like poop (stool). °Get help right away if: °· You have a fever, and your symptoms suddenly get worse. °· You leak poop or have blood in your poop. °· Your belly feels hard or bigger than normal (is bloated). °· You have very bad belly pain. °· You feel dizzy or you faint. °This information is not intended to replace advice given to you by your health care provider. Make sure you discuss any questions you have with your health care provider. °Document Released:  09/21/2007 Document Revised: 10/23/2015 Document Reviewed: 09/23/2015 °Elsevier Interactive Patient Education © 2018 [employer] Inc. ° °

## 2017-07-09 NOTE — MAU Note (Signed)
Back and abdominal pain since LMP 3/10. Has recurring BV and states she has an odor, having white/yellow discharge. States urine is cloudy, no frequency, some pressure with urination.

## 2017-07-09 NOTE — MAU Provider Note (Signed)
History     CSN: [id number]  Arrival date and time: 07/09/17 1118  Theresa Marshall is 22 y.o. G0P0  presenting with left sided abdominal pain X 1 week after her period ended.  Pain is intermittent, dull.  Worse pain rated 6/10 and at this time 4/10.  Neg for N&V, chills/fever. Hx of appendectomy.  Has had a vaginal, crearmy discharge X 1 week.  [facility] for [facility]. Hv of BV.  Hx of constipation, last BM 3 days ago, hard stool she describes as small balls.  Takes Fe supplement.  Sexually active X 1 partner, 3 months. Using condoms sometimes.  Had nexplanon removed in November for unscheduled bleeding.  Pills causes her a lot of nausea.  She is considering IUD.      Chief Complaint  Patient presents with  . Abdominal Pain  . Back Pain  . Vaginal Discharge     Past Medical History:  Diagnosis Date  . Headache(784.0)   . Infection    UTI, yeast  . Seizures (HCC)    x1, after taking flagyl  . Shortness of breath   . Vaginosis     Past Surgical History:  Procedure Laterality Date  . APPENDECTOMY  2008    History reviewed. No pertinent family history.  Social History   Tobacco Use  . Smoking status: Former Smoker    Types: Cigarettes  . Smokeless tobacco: Never Used  Substance Use Topics  . Alcohol use: Yes    Comment: last Saturday  . Drug use: Yes    Types: Marijuana    Allergies:  Allergies  Allergen Reactions  . Flagyl [Metronidazole] Other (See Comments)    seizures  . Pecan Nut (Diagnostic) Itching and Swelling    Patient is also allergic to walnuts.    Medications Prior to Admission  Medication Sig Dispense Refill Last Dose  . CRANBERRY PO Take 2 tablets by mouth 3 (three) times daily.   07/08/2017 at Unknown time  . Multiple Vitamin (MULTIVITAMIN WITH MINERALS) TABS tablet Take 1 tablet by mouth daily.   Past Week at Unknown time  . NONFORMULARY OR COMPOUNDED ITEM Place 1 suppository vaginally at bedtime as  needed (For bacterial vaginosis.). Patient completes a 5 day course of boric acid vaginal suppositories 600mg  as needed for recurring bacterial vaginosis.   Past Month at Unknown time  . Prenatal Vit-Fe Fumarate-FA (PRENATAL MULTIVITAMIN) TABS tablet Take 1 tablet by mouth daily at 12 noon.   07/08/2017 at Unknown time    Review of Systems  Constitutional: Negative for activity change, appetite change, chills and fatigue.  Respiratory: Negative for chest tightness.   Cardiovascular: Negative for chest pain.  Gastrointestinal: Positive for abdominal pain and constipation. Negative for diarrhea, nausea and vomiting. Abdominal distention: right lower abdominal pain.  Genitourinary: Positive for vaginal discharge. Negative for difficulty urinating, dysuria, frequency and vaginal bleeding.  Musculoskeletal: Negative for back pain.   Physical Exam   Blood pressure 126/75, pulse 89, temperature 98.2 F (36.8 C), temperature source Oral, resp. rate 18, last menstrual period 06/25/2017.  Physical Exam  Constitutional: She appears well-developed and well-nourished. No distress.  HENT:  Head: Normocephalic.  Neck: Normal range of motion.  Cardiovascular: Normal rate.  Respiratory: Effort normal.  GI: Soft. She exhibits no distension and no mass. There is tenderness (mild left sided tenderness with guarding/reboundd). There is no rebound and no guarding.  Genitourinary: There is no rash, tenderness or lesion on the  right labia. There is no rash, tenderness or lesion on the left labia. Uterus is not deviated, not enlarged and not tender. Cervix exhibits no motion tenderness, no discharge and no friability. Right adnexum displays no mass, no tenderness and no fullness. Left adnexum displays tenderness (mild tenderness on exam without palpable findings.). Left adnexum displays no mass and no fullness. No erythema or bleeding in the vagina. Vaginal discharge (moderate amount of slightly yelllow, creamy  discharge without odor) found.  Genitourinary Comments: Firm stool noted in rectum.    Skin: Skin is warm and dry.  Psychiatric: She has a normal mood and affect. Her behavior is normal. Thought content normal.   Results for orders placed or performed during the hospital encounter of 07/09/17 (from the past 24 hour(s))  Urinalysis, Routine w reflex microscopic     Status: Abnormal   Collection Time: 07/09/17 11:22 AM  Result Value Ref Range   Color, Urine YELLOW YELLOW   APPearance CLEAR CLEAR   Specific Gravity, Urine >1.030 (H) 1.005 - 1.030   pH 5.5 5.0 - 8.0   Glucose, UA NEGATIVE NEGATIVE mg/dL   Hgb urine dipstick NEGATIVE NEGATIVE   Bilirubin Urine NEGATIVE NEGATIVE   Ketones, ur NEGATIVE NEGATIVE mg/dL   Protein, ur NEGATIVE NEGATIVE mg/dL   Nitrite NEGATIVE NEGATIVE   Leukocytes, UA NEGATIVE NEGATIVE  Pregnancy, urine POC     Status: None   Collection Time: 07/09/17 11:45 AM  Result Value Ref Range   Preg Test, Ur NEGATIVE NEGATIVE  Wet prep, genital     Status: Abnormal   Collection Time: 07/09/17 11:49 AM  Result Value Ref Range   Yeast Wet Prep HPF POC NONE SEEN NONE SEEN   Trich, Wet Prep NONE SEEN NONE SEEN   Clue Cells Wet Prep HPF POC NONE SEEN NONE SEEN   WBC, Wet Prep HPF POC FEW (A) NONE SEEN   Sperm NONE SEEN   CBC     Status: Abnormal   Collection Time: 07/09/17 12:01 PM  Result Value Ref Range   WBC 3.9 (L) 4.0 - 10.5 K/uL   RBC 4.18 3.87 - 5.11 MIL/uL   Hemoglobin 13.2 12.0 - 15.0 g/dL   HCT 54.0 98.1 - 19.1 %   MCV 90.0 78.0 - 100.0 fL   MCH 31.6 26.0 - 34.0 pg   MCHC 35.1 30.0 - 36.0 g/dL   RDW 47.8 29.5 - 62.1 %   Platelets 251 150 - 400 K/uL   MAU Course  Procedures   STI screening done last week, not repeated here today.  MDM MSE Labs Exam Wet prep Neg   Assessment and Plan  A;  Left sided abdominal pain       Constipation  P:  Discussed importance of avoiding constipation.  To eat fiber in her diet, stay well hydrated and  exercise.  She may use OTC Colace nightly until no longer having hard stool.  May take Fe every other day for 1 week and return to daily as directed once constipation resolved.         CONDOMS every time until she decides on contraception   Eve M Key 07/09/2017, 1:26 PM

## 2017-07-18 ENCOUNTER — Other Ambulatory Visit: Payer: Self-pay

## 2017-07-18 ENCOUNTER — Emergency Department (HOSPITAL_COMMUNITY)
Admission: EM | Admit: 2017-07-18 | Discharge: 2017-07-18 | Disposition: A | Discharge status: Home / Self Care | Payer: Managed Care, Other (non HMO) | Attending: Emergency Medicine | Admitting: Emergency Medicine

## 2017-07-18 ENCOUNTER — Encounter (HOSPITAL_COMMUNITY): Payer: Self-pay | Admitting: Emergency Medicine

## 2017-07-18 DIAGNOSIS — Z79899 Other long term (current) drug therapy: Secondary | ICD-10-CM | POA: Insufficient documentation

## 2017-07-18 DIAGNOSIS — R112 Nausea with vomiting, unspecified: Secondary | ICD-10-CM | POA: Insufficient documentation

## 2017-07-18 DIAGNOSIS — R109 Unspecified abdominal pain: Secondary | ICD-10-CM | POA: Diagnosis not present

## 2017-07-18 DIAGNOSIS — R300 Dysuria: Secondary | ICD-10-CM | POA: Insufficient documentation

## 2017-07-18 DIAGNOSIS — Z87891 Personal history of nicotine dependence: Secondary | ICD-10-CM | POA: Insufficient documentation

## 2017-07-18 DIAGNOSIS — M791 Myalgia, unspecified site: Secondary | ICD-10-CM | POA: Diagnosis not present

## 2017-07-18 DIAGNOSIS — R197 Diarrhea, unspecified: Secondary | ICD-10-CM | POA: Diagnosis not present

## 2017-07-18 LAB — COMPREHENSIVE METABOLIC PANEL
ALBUMIN: 4 g/dL (ref 3.5–5.0)
ALK PHOS: 41 U/L (ref 38–126)
ALT: 13 U/L — ABNORMAL LOW (ref 14–54)
AST: 21 U/L (ref 15–41)
Anion gap: 14 (ref 5–15)
BUN: 14 mg/dL (ref 6–20)
CO2: 18 mmol/L — ABNORMAL LOW (ref 22–32)
Calcium: 9 mg/dL (ref 8.9–10.3)
Chloride: 102 mmol/L (ref 101–111)
Creatinine, Ser: 0.77 mg/dL (ref 0.44–1.00)
GFR calc Af Amer: >60 mL/min (ref >60)
GFR calc non Af Amer: >60 mL/min (ref >60)
GLUCOSE: 117 mg/dL — AB (ref 65–99)
Potassium: 3.2 mmol/L — ABNORMAL LOW (ref 3.5–5.1)
Sodium: 134 mmol/L — ABNORMAL LOW (ref 135–145)
Total Bilirubin: 1.7 mg/dL — ABNORMAL HIGH (ref 0.3–1.2)
Total Protein: 7.2 g/dL (ref 6.5–8.1)

## 2017-07-18 LAB — URINALYSIS, ROUTINE W REFLEX MICROSCOPIC
Bilirubin Urine: NEGATIVE
GLUCOSE, UA: NEGATIVE mg/dL
Ketones, ur: 20 mg/dL — AB
Nitrite: NEGATIVE
Protein, ur: 30 mg/dL — AB
SPECIFIC GRAVITY, URINE: 1.027 (ref 1.005–1.030)
pH: 5 (ref 5.0–8.0)

## 2017-07-18 LAB — CBC
HCT: 40.1 % (ref 36.0–46.0)
Hemoglobin: 14.1 g/dL (ref 12.0–15.0)
MCH: 31.4 pg (ref 26.0–34.0)
MCHC: 35.2 g/dL (ref 30.0–36.0)
MCV: 89.3 fL (ref 78.0–100.0)
PLATELETS: 213 10*3/uL (ref 150–400)
RBC: 4.49 MIL/uL (ref 3.87–5.11)
RDW: 12.4 % (ref 11.5–15.5)
WBC: 6 10*3/uL (ref 4.0–10.5)

## 2017-07-18 LAB — I-STAT BETA HCG BLOOD, ED (MC, WL, AP ONLY): HCG, QUANTITATIVE: 12.9 m[IU]/mL — AB (ref <5)

## 2017-07-18 LAB — LIPASE, BLOOD: Lipase: 19 U/L (ref 11–51)

## 2017-07-18 MED ORDER — ONDANSETRON HCL 4 MG/2ML IJ SOLN
4.0000 mg | Freq: Once | INTRAMUSCULAR | Status: AC
  Administered 2017-07-18: 4 mg via INTRAVENOUS
  Filled 2017-07-18: qty 2

## 2017-07-18 MED ORDER — CEPHALEXIN 500 MG PO CAPS: 500.0000 mg | ORAL_CAPSULE | Freq: Two times a day (BID) | ORAL | 0 refills | Status: DC

## 2017-07-18 MED ORDER — ONDANSETRON 4 MG PO TBDP: 4.0000 mg | ORAL_TABLET | Freq: Three times a day (TID) | ORAL | 0 refills | Status: DC | PRN

## 2017-07-18 MED ORDER — SODIUM CHLORIDE 0.9 % IV BOLUS
1000.0000 mL | Freq: Once | INTRAVENOUS | Status: AC
Start: 2017-07-18 — End: 2017-07-18
  Administered 2017-07-18: 1000 mL via INTRAVENOUS

## 2017-07-18 MED ORDER — MORPHINE SULFATE (PF) 4 MG/ML IV SOLN
4.0000 mg | Freq: Once | INTRAVENOUS | Status: AC
  Administered 2017-07-18: 4 mg via INTRAVENOUS
  Filled 2017-07-18: qty 1

## 2017-07-18 NOTE — ED Provider Notes (Signed)
[facility] EM[facility]ENC[facility] Provider Note   CSN: [id number] Arrival date & time: 07/18/17  0140     History   Chief Complaint Chief Complaint  Patient presents with  . Abdominal Pain    HPI Theresa Marshall is a 22 y.o. female.  Patient presents to the emergency department with a chief complaint of nausea, vomiting, diarrhea.  She reports sudden onset of symptoms yesterday.  She denies any fevers or chills.  She reports associated crampy abdominal pain and body aches.  She reports mild dysuria, but denies any hematuria or vaginal discharge.  She states that she has had friends who have been sick with the same GI symptoms in the past couple of days.  She has not taken anything for her symptoms.  There are no aggravating factors.  The history is provided by the patient. No language interpreter was used.    Past Medical History:  Diagnosis Date  . Headache(784.0)   . Infection    UTI, yeast  . Seizures (HCC)    x1, after taking flagyl  . Shortness of breath   . Vaginosis     Patient Active Problem List   Diagnosis Date Noted  . Seizures (HCC) 05/24/2013    Past Surgical History:  Procedure Laterality Date  . APPENDECTOMY  2008     OB History    Gravida  0   Para      Term      Preterm      AB      Living        SAB      TAB      Ectopic      Multiple      Live Births               Home Medications    Prior to Admission medications   Medication Sig Start Date End Date Taking? Authorizing Provider  CRANBERRY PO Take 2 tablets by mouth 3 (three) times daily.    [provider]  Multiple Vitamin (MULTIVITAMIN WITH MINERALS) TABS tablet Take 1 tablet by mouth daily.    [provider]  Prenatal Vit-Fe Fumarate-FA (PRENATAL MULTIVITAMIN) TABS tablet Take 1 tablet by mouth daily at 12 noon.    [provider]    Family History No family history on file.  Social History Social History    Tobacco Use  . Smoking status: Former Smoker    Types: Cigarettes  . Smokeless tobacco: Never Used  Substance Use Topics  . Alcohol use: Not Currently  . Drug use: Not Currently    Types: Marijuana     Allergies   Flagyl [metronidazole] and Pecan nut (diagnostic)   Review of Systems Review of Systems  All other systems reviewed and are negative.    Physical Exam Updated Vital Signs BP 108/78   Pulse 92   Temp 99.3 F (37.4 C)   Resp 18   Ht 5\' 6"  (1.676 m)   Wt 56.2 kg (124 lb)   LMP 06/25/2017   SpO2 98%   BMI 20.01 kg/m   Physical Exam  Constitutional: She is oriented to person, place, and time. She appears well-developed and well-nourished.  HENT:  Head: Normocephalic and atraumatic.  Eyes: Pupils are equal, round, and reactive to light. Conjunctivae and EOM are normal.  Neck: Normal range of motion. Neck supple.  Cardiovascular: Normal rate and regular rhythm. Exam reveals no gallop and no friction rub.  No murmur  heard. Pulmonary/Chest: Effort normal and breath sounds normal. No respiratory distress. She has no wheezes. She has no rales. She exhibits no tenderness.  Abdominal: Soft. Bowel sounds are normal. She exhibits no distension and no mass. There is no tenderness. There is no rebound and no guarding.  No focal abdominal tenderness, no RLQ tenderness or pain at McBurney's point, no RUQ tenderness or Murphy's sign, no left-sided abdominal tenderness, no fluid wave, or signs of peritonitis   Musculoskeletal: Normal range of motion. She exhibits no edema or tenderness.  Neurological: She is alert and oriented to person, place, and time.  Skin: Skin is warm and dry.  Psychiatric: She has a normal mood and affect. Her behavior is normal. Judgment and thought content normal.  Nursing note and vitals reviewed.    ED Treatments / Results  Labs (all labs ordered are listed, but only abnormal results are displayed) Labs Reviewed  COMPREHENSIVE METABOLIC  PANEL - Abnormal; Notable for the following components:      Result Value   Sodium 134 (*)    Potassium 3.2 (*)    CO2 18 (*)    Glucose, Bld 117 (*)    ALT 13 (*)    Total Bilirubin 1.7 (*)    All other components within normal limits  URINALYSIS, ROUTINE W REFLEX MICROSCOPIC - Abnormal; Notable for the following components:   APPearance HAZY (*)    Hgb urine dipstick SMALL (*)    Ketones, ur 20 (*)    Protein, ur 30 (*)    Leukocytes, UA SMALL (*)    Bacteria, UA RARE (*)    Squamous Epithelial / LPF 6-30 (*)    All other components within normal limits  I-STAT BETA HCG BLOOD, ED ([facility], [facility], [facility] ONLY) - Abnormal; Notable for the following components:   I-stat hCG, quantitative 12.9 (*)    All other components within normal limits  LIPASE, BLOOD  CBC    EKG None  Radiology No results found.  Procedures Procedures (including critical care time)  Medications Ordered in ED Medications  sodium chloride 0.9 % bolus 1,000 mL (has no administration in time range)  morphine 4 MG/ML injection 4 mg (has no administration in time range)  ondansetron (ZOFRAN) injection 4 mg (has no administration in time range)     Initial Impression / Assessment and Plan / ED Course  I have reviewed the triage vital signs and the nursing notes.  Pertinent labs & imaging results that were available during my care of the patient were reviewed by me and considered in my medical decision making (see chart for details).     Patient with nausea, vomiting, diarrhea.  Symptoms began yesterday.  Associated sick contacts.  Denies any fever.  Denies any vaginal discharge or bleeding.  She does have some dysuria.  HCG is noted to be barely elevated 12.9, likely false positive.  I have encouraged the patient to check a pregnancy test at home in a week.  Her symptoms seem to be consistent with the GI illness which has been plaguing the community for the past few days.  I doubt surgical or acute abdomen.   5:25  AM Patient reassessed.  Reports feeling much improved.  Will DC to home with zofran.  Will cover for UTI due to dysuria and abnormal UA.  Final Clinical Impressions(s) / ED Diagnoses   Final diagnoses:  Nausea vomiting and diarrhea    ED Discharge Orders        Ordered  ondansetron (ZOFRAN ODT) 4 MG disintegrating tablet  Every 8 hours PRN     07/18/17 0602    cephALEXin (KEFLEX) 500 MG capsule  2 times daily     07/18/17 0630       Roxy Horseman, PA-C 07/18/17 0631    Dione Booze, MD 07/18/17 [phone]

## 2017-07-18 NOTE — ED Notes (Signed)
ED Provider at bedside. 

## 2017-07-18 NOTE — ED Triage Notes (Signed)
Pt c/o n/v/d onset yesterday morning. Pt states began having dizzy spells this afternoon, unable to sleep d/t body aches.

## 2017-07-18 NOTE — Discharge Instructions (Signed)
Your blood pregnancy test was minimally elevated.  The value was so low, that it is likely a false positive.  However, it is advised that you check an at home urine pregnancy test in 1-2 weeks.

## 2017-12-18 ENCOUNTER — Emergency Department (HOSPITAL_COMMUNITY)
Admission: EM | Admit: 2017-12-18 | Discharge: 2017-12-18 | Disposition: A | Discharge status: Home / Self Care | Payer: Managed Care, Other (non HMO) | Attending: Emergency Medicine | Admitting: Emergency Medicine

## 2017-12-18 ENCOUNTER — Encounter (HOSPITAL_COMMUNITY): Payer: Self-pay | Admitting: Emergency Medicine

## 2017-12-18 ENCOUNTER — Other Ambulatory Visit: Payer: Self-pay

## 2017-12-18 ENCOUNTER — Emergency Department (HOSPITAL_COMMUNITY): Payer: Managed Care, Other (non HMO)

## 2017-12-18 DIAGNOSIS — G8929 Other chronic pain: Secondary | ICD-10-CM | POA: Diagnosis not present

## 2017-12-18 DIAGNOSIS — M25512 Pain in left shoulder: Secondary | ICD-10-CM | POA: Diagnosis present

## 2017-12-18 DIAGNOSIS — Z87891 Personal history of nicotine dependence: Secondary | ICD-10-CM | POA: Diagnosis not present

## 2017-12-18 DIAGNOSIS — Z79899 Other long term (current) drug therapy: Secondary | ICD-10-CM | POA: Diagnosis not present

## 2017-12-18 MED ORDER — METHOCARBAMOL 500 MG PO TABS
750.0000 mg | ORAL_TABLET | Freq: Once | ORAL | Status: AC
  Administered 2017-12-18: 750 mg via ORAL
  Filled 2017-12-18: qty 2

## 2017-12-18 MED ORDER — KETOROLAC TROMETHAMINE 15 MG/ML IJ SOLN
15.0000 mg | Freq: Once | INTRAMUSCULAR | Status: AC
  Administered 2017-12-18: 15 mg via INTRAVENOUS
  Filled 2017-12-18: qty 1

## 2017-12-18 NOTE — ED Notes (Signed)
Patient transported to X-ray 

## 2017-12-18 NOTE — ED Triage Notes (Signed)
Left shoulder pain from fall last Tuesday. Hx of injury in that same extremity.

## 2017-12-18 NOTE — Discharge Instructions (Addendum)
Please schedule an appointment with Orthopedist for reevaluation this week. You may apply ice/hot to the area.If your symptoms worsen you may return to the ED for reevaluation.

## 2017-12-18 NOTE — ED Provider Notes (Signed)
[facility] EMERGENCY DEPARTMENT Provider Note   CSN: [id number] Arrival date & time: 12/18/17  1058     History   Chief Complaint Chief Complaint  Patient presents with  . Shoulder Injury    HPI Theresa Marshall is a 22 y.o. female.  22 y.o female with a PMH of shoulder injury presents to the ED with a chief complaint of worsening left shoulder pain. She reports was seen by an orthopedist a couple months ago and had a rotator cuff injury which was going to be managed with physical therapy.  However patient had a fall in the month of August and states her symptoms have been worsening since.  Patient states she has gone back to physical therapy but has not done any of the exercises since her fall as the pain is sending.  She reports she is unable to even sleep because she cannot lay on her left shoulder.  She has tried taking Aleve, ibuprofen but states no relieving symptoms.  Patient is seen by orthopedist Dr. Dewaine Oats at no violent [place].  He denies any fever, tingling in fingers, or other complaints.     Past Medical History:  Diagnosis Date  . Headache(784.0)   . Infection    UTI, yeast  . Seizures (HCC)    x1, after taking flagyl  . Shortness of breath   . Vaginosis     Patient Active Problem List   Diagnosis Date Noted  . Seizures (HCC) 05/24/2013    Past Surgical History:  Procedure Laterality Date  . APPENDECTOMY  2008     OB History    Gravida  0   Para      Term      Preterm      AB      Living        SAB      TAB      Ectopic      Multiple      Live Births               Home Medications    Prior to Admission medications   Medication Sig Start Date End Date Taking? Authorizing Provider  cephALEXin (KEFLEX) 500 MG capsule Take 1 capsule (500 mg total) by mouth 2 (two) times daily. 07/18/17   Roxy Horseman, PA-C  CRANBERRY PO Take 2 tablets by mouth 3 (three) times daily.    [provider]    Multiple Vitamin (MULTIVITAMIN WITH MINERALS) TABS tablet Take 1 tablet by mouth daily.    [provider]  ondansetron (ZOFRAN ODT) 4 MG disintegrating tablet Take 1 tablet (4 mg total) by mouth every 8 (eight) hours as needed for nausea or vomiting. 07/18/17   Roxy Horseman, PA-C  Prenatal Vit-Fe Fumarate-FA (PRENATAL MULTIVITAMIN) TABS tablet Take 1 tablet by mouth daily at 12 noon.    [provider]    Family History No family history on file.  Social History Social History   Tobacco Use  . Smoking status: Former Smoker    Types: Cigarettes  . Smokeless tobacco: Never Used  Substance Use Topics  . Alcohol use: Not Currently  . Drug use: Not Currently    Types: Marijuana     Allergies   Flagyl [metronidazole] and Pecan nut (diagnostic)   Review of Systems Review of Systems  Constitutional: Negative for fever.  Musculoskeletal: Positive for arthralgias and myalgias. Negative for joint swelling, neck pain and neck stiffness.  All other systems reviewed  and are negative.    Physical Exam Updated Vital Signs BP 116/79   Pulse 76   Temp 98.5 F (36.9 C)   Resp 16   Ht 5\' 6"  (1.676 m)   Wt 58.1 kg   LMP 11/22/2017   SpO2 96%   BMI 20.66 kg/m   Physical Exam  Constitutional: She is oriented to person, place, and time. She appears well-developed and well-nourished.  Neck: Normal range of motion.  Cardiovascular: Normal heart sounds.  Pulmonary/Chest: Effort normal.  Abdominal: Soft.  Musculoskeletal: She exhibits no tenderness.       Left shoulder: She exhibits decreased range of motion, pain, spasm and decreased strength. She exhibits no tenderness, no bony tenderness, no swelling, no crepitus and normal pulse.  Pain upon palpation to shoulder joint. Pulses are present. Patient has decreased ROM due to pain states she's unable to move shoulder.   Neurological: She is alert and oriented to person, place, and time.  Skin: Skin is warm and dry.  Capillary refill takes less than 2 seconds.  Nursing note and vitals reviewed.    ED Treatments / Results  Labs (all labs ordered are listed, but only abnormal results are displayed) Labs Reviewed - No data to display  EKG None  Radiology Dg Shoulder Left  Result Date: 12/18/2017 CLINICAL DATA:  Chronic left shoulder pain which worsened after a fall 12/12/2017. EXAM: LEFT SHOULDER - 2+ VIEW COMPARISON:  None. FINDINGS: There is no evidence of fracture or dislocation. There is no evidence of arthropathy or other focal bone abnormality. Soft tissues are unremarkable. IMPRESSION: Normal exam Electronically Signed   By: Drusilla Kanner M.D.   On: 12/18/2017 11:44    Procedures Procedures (including critical care time)  Medications Ordered in ED Medications  ketorolac (TORADOL) 15 MG/ML injection 15 mg (15 mg Intravenous Given 12/18/17 1229)  methocarbamol (ROBAXIN) tablet 750 mg (750 mg Oral Given 12/18/17 1228)     Initial Impression / Assessment and Plan / ED Course  I have reviewed the triage vital signs and the nursing notes.  Pertinent labs & imaging results that were available during my care of the patient were reviewed by me and considered in my medical decision making (see chart for details).     Patient presents to the ED with left shoulder pain worsening after fall. DG left shoulder showed no fracture or dislocation.  Has been receiving physical therapy for a rotator cuff injury by her orthopedist.  She states she has not been able to go back to physical therapy since the fall.  I have given patient Toradol and Robaxin during the ED visit, patient states she feels better.  I have advised patient to make an appointment with orthopedist to be seen this week for their evaluation.  At this time patient is pain-free agrees and understands plan.  Return precautions provided.  Final Clinical Impressions(s) / ED Diagnoses   Final diagnoses:  Chronic left shoulder pain    ED  Discharge Orders    None       Claude Manges, PA-C 12/18/17 1332    Raeford Razor, MD 12/19/17 [phone]

## 2018-01-09 ENCOUNTER — Encounter (HOSPITAL_COMMUNITY): Payer: Self-pay | Admitting: Emergency Medicine

## 2018-01-09 ENCOUNTER — Other Ambulatory Visit: Payer: Self-pay

## 2018-01-09 ENCOUNTER — Emergency Department (HOSPITAL_COMMUNITY)
Admission: EM | Admit: 2018-01-09 | Discharge: 2018-01-10 | Disposition: A | Discharge status: Home / Self Care | Payer: Managed Care, Other (non HMO) | Attending: Emergency Medicine | Admitting: Emergency Medicine

## 2018-01-09 DIAGNOSIS — N939 Abnormal uterine and vaginal bleeding, unspecified: Secondary | ICD-10-CM | POA: Diagnosis not present

## 2018-01-09 DIAGNOSIS — N946 Dysmenorrhea, unspecified: Secondary | ICD-10-CM | POA: Diagnosis not present

## 2018-01-09 DIAGNOSIS — Z87891 Personal history of nicotine dependence: Secondary | ICD-10-CM | POA: Diagnosis not present

## 2018-01-09 DIAGNOSIS — Z3202 Encounter for pregnancy test, result negative: Secondary | ICD-10-CM | POA: Diagnosis not present

## 2018-01-09 DIAGNOSIS — Z79899 Other long term (current) drug therapy: Secondary | ICD-10-CM | POA: Diagnosis not present

## 2018-01-09 DIAGNOSIS — M545 Low back pain: Secondary | ICD-10-CM | POA: Insufficient documentation

## 2018-01-09 DIAGNOSIS — R1030 Lower abdominal pain, unspecified: Secondary | ICD-10-CM | POA: Diagnosis present

## 2018-01-09 LAB — URINALYSIS, ROUTINE W REFLEX MICROSCOPIC
Bacteria, UA: NONE SEEN
Bilirubin Urine: NEGATIVE
GLUCOSE, UA: NEGATIVE mg/dL
KETONES UR: NEGATIVE mg/dL
LEUKOCYTES UA: NEGATIVE
NITRITE: NEGATIVE
Protein, ur: NEGATIVE mg/dL
Specific Gravity, Urine: 1.026 (ref 1.005–1.030)
pH: 7 (ref 5.0–8.0)

## 2018-01-09 LAB — CBC
HCT: 40.2 % (ref 36.0–46.0)
HEMOGLOBIN: 13.4 g/dL (ref 12.0–15.0)
MCH: 30.7 pg (ref 26.0–34.0)
MCHC: 33.3 g/dL (ref 30.0–36.0)
MCV: 92 fL (ref 78.0–100.0)
Platelets: 252 10*3/uL (ref 150–400)
RBC: 4.37 MIL/uL (ref 3.87–5.11)
RDW: 12.1 % (ref 11.5–15.5)
WBC: 6 10*3/uL (ref 4.0–10.5)

## 2018-01-09 LAB — COMPREHENSIVE METABOLIC PANEL
ALK PHOS: 48 U/L (ref 38–126)
ALT: 15 U/L (ref 0–44)
ANION GAP: 8 (ref 5–15)
AST: 20 U/L (ref 15–41)
Albumin: 3.9 g/dL (ref 3.5–5.0)
BILIRUBIN TOTAL: 0.9 mg/dL (ref 0.3–1.2)
BUN: 17 mg/dL (ref 6–20)
CALCIUM: 9.3 mg/dL (ref 8.9–10.3)
CO2: 24 mmol/L (ref 22–32)
Chloride: 108 mmol/L (ref 98–111)
Creatinine, Ser: 0.79 mg/dL (ref 0.44–1.00)
GFR calc non Af Amer: >60 mL/min (ref >60)
Glucose, Bld: 103 mg/dL — ABNORMAL HIGH (ref 70–99)
Potassium: 3.7 mmol/L (ref 3.5–5.1)
Sodium: 140 mmol/L (ref 135–145)
TOTAL PROTEIN: 7.2 g/dL (ref 6.5–8.1)

## 2018-01-09 LAB — HCG, QUANTITATIVE, PREGNANCY: hCG, Beta Chain, Quant, S: <1 m[IU]/mL (ref <5)

## 2018-01-09 LAB — LIPASE, BLOOD: Lipase: 28 U/L (ref 11–51)

## 2018-01-09 LAB — I-STAT BETA HCG BLOOD, ED (MC, WL, AP ONLY): I-stat hCG, quantitative: <5 m[IU]/mL (ref <5)

## 2018-01-09 MED ORDER — KETOROLAC TROMETHAMINE 60 MG/2ML IM SOLN
30.0000 mg | Freq: Once | INTRAMUSCULAR | Status: AC
  Administered 2018-01-09: 30 mg via INTRAMUSCULAR
  Filled 2018-01-09: qty 2

## 2018-01-09 NOTE — ED Triage Notes (Signed)
Pt c/o pelvic pressure, back pain and spotting that started today. States she took a pregnancy test at home that was positive.

## 2018-01-09 NOTE — ED Provider Notes (Signed)
[facility] EMERGENCY DEPARTMENT Provider Note   CSN: [id number] Arrival date & time: 01/09/18  2023     History   Chief Complaint Chief Complaint  Patient presents with  . Abdominal Pain    HPI Theresa Marshall is a 22 y.o. female.  22 year old female with no significant past medical history presents to the emergency department for evaluation of lower abdominal and low back pressure.  Symptoms began this afternoon and were followed by onset of vaginal bleeding.  Patient states that her bleeding seemed more consistent with "spotting".  She endorses passing tissue like substance as well as blood clots.  Notes taking a home pregnancy test recently which seemed faintly positive.  She has a history of irregular menses with LMP 2 months ago.  She is sexually active daily.  Denies concern for STDs.  Was recently screened for STDs by her OB/GYN 1 month ago at her last outpatient visit.  She has not taken any medications for her abdominal pain or back pain as she has not found that over-the-counter medications have helped her in the past.  Denies any fever, vomiting, dysuria.  Abdominal surgical hx significant for appendectomy.     Past Medical History:  Diagnosis Date  . Headache(784.0)   . Infection    UTI, yeast  . Seizures (HCC)    x1, after taking flagyl  . Shortness of breath   . Vaginosis     Patient Active Problem List   Diagnosis Date Noted  . Seizures (HCC) 05/24/2013    Past Surgical History:  Procedure Laterality Date  . APPENDECTOMY  2008     OB History    Gravida  0   Para      Term      Preterm      AB      Living        SAB      TAB      Ectopic      Multiple      Live Births               Home Medications    Prior to Admission medications   Medication Sig Start Date End Date Taking? Authorizing Provider  cephALEXin (KEFLEX) 500 MG capsule Take 1 capsule (500 mg total) by mouth 2 (two) times daily. 07/18/17    Roxy Horseman, PA-C  CRANBERRY PO Take 2 tablets by mouth 3 (three) times daily.    [provider]  Multiple Vitamin (MULTIVITAMIN WITH MINERALS) TABS tablet Take 1 tablet by mouth daily.    [provider]  ondansetron (ZOFRAN ODT) 4 MG disintegrating tablet Take 1 tablet (4 mg total) by mouth every 8 (eight) hours as needed for nausea or vomiting. 07/18/17   Roxy Horseman, PA-C  Prenatal Vit-Fe Fumarate-FA (PRENATAL MULTIVITAMIN) TABS tablet Take 1 tablet by mouth daily at 12 noon.    [provider]    Family History No family history on file.  Social History Social History   Tobacco Use  . Smoking status: Former Smoker    Types: Cigarettes  . Smokeless tobacco: Never Used  Substance Use Topics  . Alcohol use: Not Currently  . Drug use: Not Currently    Types: Marijuana     Allergies   Flagyl [metronidazole] and Pecan nut (diagnostic)   Review of Systems Review of Systems Ten systems reviewed and are negative for acute change, except as noted in the HPI.    Physical Exam  Updated Vital Signs BP 117/85   Pulse 83   Temp 98.8 F (37.1 C)   Resp 16   LMP 11/13/2017   SpO2 98%   Physical Exam  Constitutional: She is oriented to person, place, and time. She appears well-developed and well-nourished. No distress.  Nontoxic appearing and in NAD  HENT:  Head: Normocephalic and atraumatic.  Eyes: Conjunctivae and EOM are normal. No scleral icterus.  Neck: Normal range of motion.  Cardiovascular: Normal rate, regular rhythm and intact distal pulses.  Pulmonary/Chest: Effort normal. No respiratory distress.  Respirations even and unlabored  Genitourinary:  Genitourinary Comments: Blood in vaginal vault. No cervical friability or CMT. No adnexal TTP. Mild tenderness on palpation of uterus.  Musculoskeletal: Normal range of motion.  Neurological: She is alert and oriented to person, place, and time. She exhibits normal muscle tone.  Coordination normal.  Skin: Skin is warm and dry. No rash noted. She is not diaphoretic. No erythema. No pallor.  Psychiatric: She has a normal mood and affect. Her behavior is normal.  Nursing note and vitals reviewed.    ED Treatments / Results  Labs (all labs ordered are listed, but only abnormal results are displayed) Labs Reviewed  WET PREP, GENITAL - Abnormal; Notable for the following components:      Result Value   Clue Cells Wet Prep HPF POC PRESENT (*)    WBC, Wet Prep HPF POC MANY (*)    All other components within normal limits  COMPREHENSIVE METABOLIC PANEL - Abnormal; Notable for the following components:   Glucose, Bld 103 (*)    All other components within normal limits  URINALYSIS, ROUTINE W REFLEX MICROSCOPIC - Abnormal; Notable for the following components:   Hgb urine dipstick LARGE (*)    RBC / HPF >50 (*)    All other components within normal limits  LIPASE, BLOOD  CBC  HCG, QUANTITATIVE, PREGNANCY  I-STAT BETA HCG BLOOD, ED ([facility], [facility], [facility] ONLY)    EKG None  Radiology No results found.  Procedures Procedures (including critical care time)  Medications Ordered in ED Medications  ketorolac (TORADOL) injection 30 mg (30 mg Intramuscular Given 01/09/18 2305)     Initial Impression / Assessment and Plan / ED Course  I have reviewed the triage vital signs and the nursing notes.  Pertinent labs & imaging results that were available during my care of the patient were reviewed by me and considered in my medical decision making (see chart for details).     22 year old female presents to the emergency department for evaluation of lower abdominal pain and back pain associated with onset of menses today.  Last menstrual period was 2 months ago.  She expressed concern over possible pregnancy, but pregnancy test today is negative x2.  Laboratory work-up is reassuring.  The patient denies concern for STDs.  She has had no change in her sexual partners.  Was  recently screened for STDs 1 month ago.  The patient has no cervical motion tenderness or adnexal tenderness on bimanual exam.  Suspect symptoms to be secondary to uncomplicated dysmenorrhea, likely in the setting of metrorrhagia.  Patient given Toradol in the emergency department for management of pain.  She has been instructed to follow-up with her OB/GYN regarding her symptoms.  Return precautions discussed and provided. Patient discharged in stable condition with no unaddressed concerns.   Final Clinical Impressions(s) / ED Diagnoses   Final diagnoses:  Dysmenorrhea    ED Discharge Orders    None  Antony Madura, PA-C 01/10/18 0029    Lorre Nick, MD 01/10/18 [phone]

## 2018-01-10 LAB — WET PREP, GENITAL
Sperm: NONE SEEN
TRICH WET PREP: NONE SEEN
Yeast Wet Prep HPF POC: NONE SEEN

## 2018-01-10 NOTE — Discharge Instructions (Signed)
Continue with over-the-counter medication such as Tylenol and/or ibuprofen for management of pain.  You may also apply topical heating pads to your abdomen or back for pain control.  Follow-up with your OB/GYN or primary care doctor regarding your visit to the emergency department today.  Your work-up in the ED has been reassuring.  You may return for any new or concerning symptoms.

## 2018-01-10 NOTE — ED Notes (Signed)
Pt.dont want to wait on her discharge papers she just want to leave.

## 2018-08-11 ENCOUNTER — Emergency Department (HOSPITAL_COMMUNITY): Payer: Managed Care, Other (non HMO)

## 2018-08-11 ENCOUNTER — Emergency Department (HOSPITAL_COMMUNITY)
Admission: EM | Admit: 2018-08-11 | Discharge: 2018-08-11 | Disposition: A | Discharge status: Home / Self Care | Payer: Managed Care, Other (non HMO) | Attending: Emergency Medicine | Admitting: Emergency Medicine

## 2018-08-11 ENCOUNTER — Encounter (HOSPITAL_COMMUNITY): Payer: Self-pay

## 2018-08-11 ENCOUNTER — Other Ambulatory Visit: Payer: Self-pay

## 2018-08-11 DIAGNOSIS — R109 Unspecified abdominal pain: Secondary | ICD-10-CM | POA: Insufficient documentation

## 2018-08-11 DIAGNOSIS — Z87891 Personal history of nicotine dependence: Secondary | ICD-10-CM | POA: Insufficient documentation

## 2018-08-11 DIAGNOSIS — Z91010 Allergy to peanuts: Secondary | ICD-10-CM | POA: Diagnosis not present

## 2018-08-11 DIAGNOSIS — R102 Pelvic and perineal pain: Secondary | ICD-10-CM | POA: Diagnosis not present

## 2018-08-11 LAB — CBC
HCT: 39.3 % (ref 36.0–46.0)
Hemoglobin: 13.3 g/dL (ref 12.0–15.0)
MCH: 31.1 pg (ref 26.0–34.0)
MCHC: 33.8 g/dL (ref 30.0–36.0)
MCV: 92 fL (ref 80.0–100.0)
Platelets: 222 10*3/uL (ref 150–400)
RBC: 4.27 MIL/uL (ref 3.87–5.11)
RDW: 12.5 % (ref 11.5–15.5)
WBC: 4.8 10*3/uL (ref 4.0–10.5)
nRBC: 0 % (ref 0.0–0.2)

## 2018-08-11 LAB — URINALYSIS, ROUTINE W REFLEX MICROSCOPIC
Bacteria, UA: NONE SEEN
Bilirubin Urine: NEGATIVE
Glucose, UA: NEGATIVE mg/dL
Hgb urine dipstick: NEGATIVE
Ketones, ur: 20 mg/dL — AB
Nitrite: NEGATIVE
Protein, ur: NEGATIVE mg/dL
Specific Gravity, Urine: 1.016 (ref 1.005–1.030)
pH: 7 (ref 5.0–8.0)

## 2018-08-11 LAB — COMPREHENSIVE METABOLIC PANEL
ALT: 16 U/L (ref 0–44)
AST: 19 U/L (ref 15–41)
Albumin: 4.6 g/dL (ref 3.5–5.0)
Alkaline Phosphatase: 43 U/L (ref 38–126)
Anion gap: 10 (ref 5–15)
BUN: 16 mg/dL (ref 6–20)
CO2: 20 mmol/L — ABNORMAL LOW (ref 22–32)
Calcium: 9.3 mg/dL (ref 8.9–10.3)
Chloride: 107 mmol/L (ref 98–111)
Creatinine, Ser: 0.86 mg/dL (ref 0.44–1.00)
GFR calc Af Amer: >60 mL/min (ref >60)
GFR calc non Af Amer: >60 mL/min (ref >60)
Glucose, Bld: 105 mg/dL — ABNORMAL HIGH (ref 70–99)
Potassium: 3.2 mmol/L — ABNORMAL LOW (ref 3.5–5.1)
Sodium: 137 mmol/L (ref 135–145)
Total Bilirubin: 1 mg/dL (ref 0.3–1.2)
Total Protein: 7.7 g/dL (ref 6.5–8.1)

## 2018-08-11 LAB — I-STAT BETA HCG BLOOD, ED (MC, WL, AP ONLY): I-stat hCG, quantitative: <5 m[IU]/mL (ref <5)

## 2018-08-11 LAB — LIPASE, BLOOD: Lipase: 29 U/L (ref 11–51)

## 2018-08-11 MED ORDER — IOHEXOL 300 MG/ML  SOLN
100.0000 mL | Freq: Once | INTRAMUSCULAR | Status: AC | PRN
  Administered 2018-08-11: 100 mL via INTRAVENOUS

## 2018-08-11 MED ORDER — SODIUM CHLORIDE (PF) 0.9 % IJ SOLN
INTRAMUSCULAR | Status: AC
  Filled 2018-08-11: qty 50

## 2018-08-11 MED ORDER — SODIUM CHLORIDE 0.9% FLUSH: 3.0000 mL | Freq: Once | INTRAVENOUS | Status: DC

## 2018-08-11 MED ORDER — LACTATED RINGERS IV BOLUS
1000.0000 mL | Freq: Once | INTRAVENOUS | Status: AC
  Administered 2018-08-11: 1000 mL via INTRAVENOUS

## 2018-08-11 MED ORDER — PROMETHAZINE HCL 25 MG RE SUPP: 25.0000 mg | Freq: Four times a day (QID) | RECTAL | 0 refills | Status: DC | PRN

## 2018-08-11 MED ORDER — METOCLOPRAMIDE HCL 5 MG/ML IJ SOLN
10.0000 mg | Freq: Once | INTRAMUSCULAR | Status: AC
  Administered 2018-08-11: 10 mg via INTRAVENOUS
  Filled 2018-08-11: qty 2

## 2018-08-11 NOTE — ED Notes (Signed)
Patient transported to CT 

## 2018-08-11 NOTE — ED Provider Notes (Signed)
Emergency Department Provider Note   I have reviewed the triage vital signs and the nursing notes.   HISTORY  Chief Complaint Emesis   HPI Theresa Marshall is a 23 y.o. female who presents the emergency department today for months worth of nonbloody nonbilious emesis.  Patient states that she thinks this happens more often in the morning and in the evening.  Sometimes gets better with Zofran and sometimes it does not.  She will vomit multiple times in a row causes significant abdominal cramping.  She does smoke marijuana but does not stage related to that.  She does not think it is related to food either.  She is 1 week late on her period but her periods are often irregular.  She has checked two pregnancy test a couple weeks ago that were both negative.   Some constipation but is having bowel movements.  No vaginal discharge or urinary symptoms.  No other associated or modifying symptoms.    Past Medical History:  Diagnosis Date  . Headache(784.0)   . Infection    UTI, yeast  . Seizures (HCC)    x1, after taking flagyl  . Shortness of breath   . Vaginosis     Patient Active Problem List   Diagnosis Date Noted  . Seizures (HCC) 05/24/2013    Past Surgical History:  Procedure Laterality Date  . APPENDECTOMY  2008    Current Outpatient Rx  . Order #: [id number] Class: Print  . Order #: [id number] Class: Historical Med  . Order #: [id number] Class: Historical Med  . Order #: [id number] Class: Print  . Order #: [id number] Class: Historical Med    Allergies Metronidazole and Pecan nut (diagnostic)  No family history on file.  Social History Social History   Tobacco Use  . Smoking status: Former Smoker    Types: Cigarettes  . Smokeless tobacco: Never Used  Substance Use Topics  . Alcohol use: Not Currently  . Drug use: Not Currently    Types: Marijuana    Review of Systems  All other systems negative except as documented in the HPI. All pertinent positives and  negatives as reviewed in the HPI. ____________________________________________   PHYSICAL EXAM:  VITAL SIGNS: ED Triage Vitals  Enc Vitals Group     BP 08/11/18 0611 119/75     Pulse Rate 08/11/18 0611 84     Resp 08/11/18 0611 18     Temp 08/11/18 0611 97.7 F (36.5 C)     Temp Source 08/11/18 0611 Oral     SpO2 08/11/18 0611 100 %     Weight 08/11/18 0611 131 lb (59.4 kg)     Height 08/11/18 0611 5' 5.5" (1.664 m)     Head Circumference --      Peak Flow --      Pain Score 08/11/18 0612 7     Pain Loc --      Pain Edu? --      Excl. in GC? --     Constitutional: Alert and oriented. Well appearing and in no acute distress. Eyes: Conjunctivae are normal. PERRL. EOMI. Head: Atraumatic. Nose: No congestion/rhinnorhea. Mouth/Throat: Mucous membranes are moist.  Oropharynx non-erythematous. Neck: No stridor.  No meningeal signs.   Cardiovascular: Normal rate, regular rhythm. Good peripheral circulation. Grossly normal heart sounds.   Respiratory: Normal respiratory effort.  No retractions. Lungs CTAB. Gastrointestinal: Soft and nontender. No distention.  Musculoskeletal: No lower extremity tenderness nor edema. No gross deformities of extremities. Neurologic:  Normal speech and language.  No gross focal neurologic deficits are appreciated.  Skin:  Skin is warm, dry and intact. No rash noted.   ____________________________________________   LABS (all labs ordered are listed, but only abnormal results are displayed)  Labs Reviewed  COMPREHENSIVE METABOLIC PANEL - Abnormal; Notable for the following components:      Result Value   Potassium 3.2 (*)    CO2 20 (*)    Glucose, Bld 105 (*)    All other components within normal limits  URINALYSIS, ROUTINE W REFLEX MICROSCOPIC - Abnormal; Notable for the following components:   Ketones, ur 20 (*)    Leukocytes,Ua TRACE (*)    All other components within normal limits  LIPASE, BLOOD  CBC  I-STAT BETA HCG BLOOD, ED ([facility], [facility],  [facility] ONLY)   ____________________________________________  RADIOLOGY  No results found.  ____________________________________________   PROCEDURES  Procedure(s) performed:   Procedures   ____________________________________________   INITIAL IMPRESSION / ASSESSMENT AND PLAN / ED COURSE  Evaluate for pancreatitis, urinary tract infection, pregnancy, gallbladder pathology.  Low suspicion for bowel obstruction.  Does not seem to be improve or worsen right after eating so I do not think it is a gastric/peptic ulcer.  [facility] however does not have the classic signs of that either.  Pertinent labs & imaging results that were available during my care of the patient were reviewed by me and considered in my medical decision making (see chart for details).  A medical screening exam was performed and I feel the patient has had an appropriate workup for their chief complaint at this time and likelihood of emergent condition existing is low. They have been counseled on decision, discharge, follow up and which symptoms necessitate immediate return to the emergency department. They or their family verbally stated understanding and agreement with plan and discharged in stable condition.   ____________________________________________  FINAL CLINICAL IMPRESSION(S) / ED DIAGNOSES  Final diagnoses:  None     MEDICATIONS GIVEN DURING THIS VISIT:  Medications  sodium chloride flush (NS) 0.9 % injection 3 mL (has no administration in time range)  lactated ringers bolus 1,000 mL (has no administration in time range)  metoCLOPramide (REGLAN) injection 10 mg (has no administration in time range)     NEW OUTPATIENT MEDICATIONS STARTED DURING THIS VISIT:  New Prescriptions   No medications on file    Note:  This note was prepared with assistance of Dragon voice recognition software. Occasional wrong-word or sound-a-like substitutions may have occurred due to the  inherent limitations of voice recognition software.   Nikola Marone, Barbara CowerJason, MD 08/12/18 0430

## 2018-08-11 NOTE — ED Notes (Signed)
Requested urine from patient. 

## 2018-08-11 NOTE — ED Notes (Signed)
Patient ambulated to the bathroom w/ a steady gate. 

## 2018-08-11 NOTE — ED Notes (Signed)
Patient given discharge teaching and verbalized understanding. Patient ambulated out of ED with a steady gait. 

## 2018-08-11 NOTE — ED Provider Notes (Signed)
Feels much better at this time.  Home with outpatient GI follow-up.  She will likely benefit from endoscopy.  Addition of rectal Phenergan prescribed.  She was having left lower quadrant tenderness and has had tenderness over the past several weeks on my examination and thus she underwent CT imaging of her abdomen pelvis which demonstrates a degenerating corpus luteum cyst in the left lower quadrant which could be the cause of her pain.  Outpatient primary care and GYN follow-up as well.  Patient understands return to the ER for new or worsening symptoms    Ct Abdomen Pelvis W Contrast  Result Date: 08/11/2018 CLINICAL DATA:  23 year old female with history of left lower quadrant abdominal pain for the past 2 days. EXAM: CT ABDOMEN AND PELVIS WITH CONTRAST TECHNIQUE: Multidetector CT imaging of the abdomen and pelvis was performed using the standard protocol following bolus administration of intravenous contrast. CONTRAST:  OMNIPAQUE IOHEXOL 300 MG/ML  SOLN COMPARISON:  CT the abdomen and pelvis 09/10/2006. FINDINGS: Lower chest: Unremarkable. Hepatobiliary: No suspicious cystic or solid hepatic lesions. No intra or extrahepatic biliary ductal dilatation. Gallbladder is normal in appearance. Pancreas: No pancreatic mass. No pancreatic ductal dilatation. No pancreatic or peripancreatic fluid or inflammatory changes. Spleen: Unremarkable. Adrenals/Urinary Tract: Bilateral kidneys and bilateral adrenal glands are normal in appearance. No hydroureteronephrosis. Urinary bladder is normal in appearance. Stomach/Bowel: Normal appearance of the stomach. No pathologic dilatation of small bowel or colon. Status post appendectomy. Vascular/Lymphatic: No significant atherosclerotic disease, aneurysm or dissection noted in the abdominal or pelvic vasculature. No lymphadenopathy noted in the abdomen or pelvis. Reproductive: Uterus and right ovary are unremarkable in appearance. Small rim enhancing lesion in the left  ovary measuring 1.6 cm in diameter (axial image 55 of series 2), most compatible with a degenerating corpus luteum cyst. Other: No significant volume of ascites.  No pneumoperitoneum. Musculoskeletal: There are no aggressive appearing lytic or blastic lesions noted in the visualized portions of the skeleton. IMPRESSION: 1. No acute findings are noted in the abdomen or pelvis to account for the patient's symptoms. 2. There is, however, what appears to be a degenerating corpus luteum cyst in the left adnexal region which may account for the patient's symptoms. Electronically Signed   By: Trudie Reed M.D.   On: 08/11/2018 09:18   US Abdomen Limited Ruq  Result Date: 08/11/2018 CLINICAL DATA:  Abdominal pain. EXAM: ULTRASOUND ABDOMEN LIMITED RIGHT UPPER QUADRANT COMPARISON:  None. FINDINGS: Gallbladder: No gallstones or wall thickening visualized. No sonographic Murphy sign noted by sonographer. Common bile duct: Diameter: Normal, 2.3 mm Liver: No focal lesion identified. Within normal limits in parenchymal echogenicity. Portal vein is patent on color Doppler imaging with normal direction of blood flow towards the liver. IMPRESSION: Negative. Electronically Signed   By: Elsie Stain M.D.   On: 08/11/2018 07:35      Azalia Bilis, MD 08/11/18 1036

## 2018-08-11 NOTE — ED Notes (Signed)
Urine culture sent to the lab. 

## 2018-08-11 NOTE — ED Triage Notes (Signed)
Patient states she is vomiting a lot. Patient went to primary care and was prescribed zofran and prilosec. Patient states that it is not working and she is not able to keep food on her stomach.

## 2018-08-12 ENCOUNTER — Emergency Department (HOSPITAL_COMMUNITY)
Admission: EM | Admit: 2018-08-12 | Discharge: 2018-08-12 | Disposition: A | Discharge status: Home / Self Care | Payer: Managed Care, Other (non HMO) | Attending: Emergency Medicine | Admitting: Emergency Medicine

## 2018-08-12 ENCOUNTER — Emergency Department (HOSPITAL_COMMUNITY): Payer: Managed Care, Other (non HMO)

## 2018-08-12 ENCOUNTER — Other Ambulatory Visit: Payer: Self-pay

## 2018-08-12 DIAGNOSIS — Z79899 Other long term (current) drug therapy: Secondary | ICD-10-CM | POA: Insufficient documentation

## 2018-08-12 DIAGNOSIS — R102 Pelvic and perineal pain: Secondary | ICD-10-CM | POA: Diagnosis not present

## 2018-08-12 DIAGNOSIS — Z87891 Personal history of nicotine dependence: Secondary | ICD-10-CM | POA: Insufficient documentation

## 2018-08-12 DIAGNOSIS — R1032 Left lower quadrant pain: Secondary | ICD-10-CM | POA: Diagnosis not present

## 2018-08-12 DIAGNOSIS — R109 Unspecified abdominal pain: Secondary | ICD-10-CM | POA: Diagnosis present

## 2018-08-12 MED ORDER — TRAMADOL HCL 50 MG PO TABS: 50.0000 mg | ORAL_TABLET | Freq: Four times a day (QID) | ORAL | 0 refills | Status: DC | PRN

## 2018-08-12 MED ORDER — MORPHINE SULFATE (PF) 4 MG/ML IV SOLN: 4.0000 mg | Freq: Once | INTRAVENOUS | Status: DC

## 2018-08-12 MED ORDER — KETOROLAC TROMETHAMINE 30 MG/ML IJ SOLN
30.0000 mg | Freq: Once | INTRAMUSCULAR | Status: AC
  Administered 2018-08-12: 30 mg via INTRAVENOUS
  Filled 2018-08-12: qty 1

## 2018-08-12 MED ORDER — SODIUM CHLORIDE 0.9 % IV BOLUS
1000.0000 mL | Freq: Once | INTRAVENOUS | Status: AC
  Administered 2018-08-12: 09:00:00 1000 mL via INTRAVENOUS

## 2018-08-12 NOTE — ED Triage Notes (Signed)
Pt arrived via ems with reports of LLQ abd pain and nausea. Was seen yesterday for N/V and was dx'ed with a cyst.

## 2018-08-12 NOTE — ED Provider Notes (Signed)
[facility]-EMERGENCY DEPT Provider Note   CSN: [id number] Arrival date & time: 08/12/18  1191    History   Chief Complaint Chief Complaint  Patient presents with   Abdominal Pain    HPI Theresa Marshall is a 23 y.o. female.     HPI Patient presents to the emergency department with lower quadrant abdominal pain.  She was seen yesterday for nausea vomiting with lower abdominal pain diagnosed with the left ovarian cyst.  The patient states that the nausea vomiting has improved but she is now having this continued left lower quadrant abdominal pain.  Patient states she awoke at 3 AM with sharp pain in the left lower quadrant.  The patient states that she has had no vaginal bleeding or discharge.  Patient states that she did not take any medications prior to arrival for her symptoms.  Patient states that she has had ovarian cysts in the past.  The patient denies chest pain, shortness of breath, headache,blurred vision, neck pain, fever, cough, weakness, numbness, dizziness, anorexia, edema,  nausea, vomiting, diarrhea, rash, back pain, dysuria, hematemesis, bloody stool, near syncope, or syncope. Past Medical History:  Diagnosis Date   Headache(784.0)    Infection    UTI, yeast   Seizures (HCC)    x1, after taking flagyl   Shortness of breath    Vaginosis     Patient Active Problem List   Diagnosis Date Noted   Seizures (HCC) 05/24/2013    Past Surgical History:  Procedure Laterality Date   APPENDECTOMY  2008     OB History    Gravida  0   Para      Term      Preterm      AB      Living        SAB      TAB      Ectopic      Multiple      Live Births               Home Medications    Prior to Admission medications   Medication Sig Start Date End Date Taking? Authorizing Provider  ondansetron (ZOFRAN-ODT) 8 MG disintegrating tablet Take 8 mg by mouth every 8 (eight) hours as needed for nausea/vomiting. 07/23/18  Yes  [provider]  pantoprazole (PROTONIX) 40 MG tablet Take 40 mg by mouth daily. 07/23/18  Yes [provider]  promethazine (PHENERGAN) 25 MG suppository Place 1 suppository (25 mg total) rectally every 6 (six) hours as needed for nausea or vomiting. 08/11/18   Azalia Bilis, MD  VENTOLIN HFA 108 [phone] Base) MCG/ACT inhaler Take 2 puffs by mouth every 6 (six) hours as needed for wheezing. 06/11/18   [provider]    Family History No family history on file.  Social History Social History   Tobacco Use   Smoking status: Former Smoker    Types: Cigarettes   Smokeless tobacco: Never Used  Substance Use Topics   Alcohol use: Not Currently   Drug use: Not Currently    Types: Marijuana     Allergies   Metronidazole and Pecan nut (diagnostic)   Review of Systems Review of Systems All other systems negative except as documented in the HPI. All pertinent positives and negatives as reviewed in the HPI.  Physical Exam Updated Vital Signs BP 114/70 (BP Location: Left Arm)    Pulse 67    Temp 97.7 F (36.5 C) (Oral)  Resp 16    Ht  (1.651 m)    Wt 59 kg    SpO2 100%    BMI 21.64 kg/m   Physical Exam Vitals signs and nursing note reviewed.  Constitutional:      General: She is not in acute distress.    Appearance: She is well-developed.  HENT:     Head: Normocephalic and atraumatic.  Eyes:     Pupils: Pupils are equal, round, and reactive to light.  Neck:     Musculoskeletal: Normal range of motion and neck supple.  Cardiovascular:     Rate and Rhythm: Normal rate and regular rhythm.     Heart sounds: Normal heart sounds. No murmur. No friction rub. No gallop.   Pulmonary:     Effort: Pulmonary effort is normal. No respiratory distress.     Breath sounds: Normal breath sounds. No wheezing.  Abdominal:     General: Bowel sounds are normal. There is no distension.     Palpations: Abdomen is soft.     Tenderness: There is abdominal tenderness  in the left lower quadrant. There is no guarding or rebound.  Skin:    General: Skin is warm and dry.     Capillary Refill: Capillary refill takes less than 2 seconds.     Findings: No erythema or rash.  Neurological:     Mental Status: She is alert and oriented to person, place, and time.     Motor: No abnormal muscle tone.     Coordination: Coordination normal.  Psychiatric:        Behavior: Behavior normal.      ED Treatments / Results  Labs (all labs ordered are listed, but only abnormal results are displayed) Labs Reviewed - No data to display  EKG None  Radiology US Transvaginal Non-ob  Result Date: 08/12/2018 CLINICAL DATA:  Left lower quadrant pain for a day. Corpus luteum cyst seen in the left ovary on a CT scan from August 11, 2018. EXAM: TRANSABDOMINAL AND TRANSVAGINAL ULTRASOUND OF PELVIS DOPPLER ULTRASOUND OF OVARIES TECHNIQUE: Both transabdominal and transvaginal ultrasound examinations of the pelvis were performed. Transabdominal technique was performed for global imaging of the pelvis including uterus, ovaries, adnexal regions, and pelvic cul-de-sac. It was necessary to proceed with endovaginal exam following the transabdominal exam to visualize the endometrium and ovaries. Color and duplex Doppler ultrasound was utilized to evaluate blood flow to the ovaries. COMPARISON:  CT scan August 11, 2018 FINDINGS: Uterus Measurements: 7.3 x 3.1 x 3.9 cm = volume: 46 mL. No fibroids or other mass visualized. Endometrium Thickness: 5.4 mm.  No focal abnormality visualized. Right ovary Measurements: 2.8 x 1.5 x 1.3 cm = volume: 2.7 mL. Normal appearance/no adnexal mass. Left ovary Measurements: 3.4 x 3.0 x 2.1 cm = volume: 10.7 mL. Contains a 1.5 cm follicle Pulsed Doppler evaluation of both ovaries demonstrates normal low-resistance arterial and venous waveforms. Other findings A small amount of physiologic fluid is seen in the pelvis. IMPRESSION: 1. Normal size follicle in left ovary  measuring 1.5 cm. 2. No other abnormalities identified. Electronically Signed   By: Gerome Sam III M.D   On: 08/12/2018 10:34   US Pelvis Complete  Result Date: 08/12/2018 CLINICAL DATA:  Left lower quadrant pain for a day. Corpus luteum cyst seen in the left ovary on a CT scan from August 11, 2018. EXAM: TRANSABDOMINAL AND TRANSVAGINAL ULTRASOUND OF PELVIS DOPPLER ULTRASOUND OF OVARIES TECHNIQUE: Both transabdominal and transvaginal ultrasound examinations of the pelvis  were performed. Transabdominal technique was performed for global imaging of the pelvis including uterus, ovaries, adnexal regions, and pelvic cul-de-sac. It was necessary to proceed with endovaginal exam following the transabdominal exam to visualize the endometrium and ovaries. Color and duplex Doppler ultrasound was utilized to evaluate blood flow to the ovaries. COMPARISON:  CT scan August 11, 2018 FINDINGS: Uterus Measurements: 7.3 x 3.1 x 3.9 cm = volume: 46 mL. No fibroids or other mass visualized. Endometrium Thickness: 5.4 mm.  No focal abnormality visualized. Right ovary Measurements: 2.8 x 1.5 x 1.3 cm = volume: 2.7 mL. Normal appearance/no adnexal mass. Left ovary Measurements: 3.4 x 3.0 x 2.1 cm = volume: 10.7 mL. Contains a 1.5 cm follicle Pulsed Doppler evaluation of both ovaries demonstrates normal low-resistance arterial and venous waveforms. Other findings A small amount of physiologic fluid is seen in the pelvis. IMPRESSION: 1. Normal size follicle in left ovary measuring 1.5 cm. 2. No other abnormalities identified. Electronically Signed   By: Gerome Samavid  Williams III M.D   On: 08/12/2018 10:34   Ct Abdomen Pelvis W Contrast  Result Date: 08/11/2018 CLINICAL DATA:  23 year old female with history of left lower quadrant abdominal pain for the past 2 days. EXAM: CT ABDOMEN AND PELVIS WITH CONTRAST TECHNIQUE: Multidetector CT imaging of the abdomen and pelvis was performed using the standard protocol following bolus  administration of intravenous contrast. CONTRAST:  100mL OMNIPAQUE IOHEXOL 300 MG/ML  SOLN COMPARISON:  CT the abdomen and pelvis 09/10/2006. FINDINGS: Lower chest: Unremarkable. Hepatobiliary: No suspicious cystic or solid hepatic lesions. No intra or extrahepatic biliary ductal dilatation. Gallbladder is normal in appearance. Pancreas: No pancreatic mass. No pancreatic ductal dilatation. No pancreatic or peripancreatic fluid or inflammatory changes. Spleen: Unremarkable. Adrenals/Urinary Tract: Bilateral kidneys and bilateral adrenal glands are normal in appearance. No hydroureteronephrosis. Urinary bladder is normal in appearance. Stomach/Bowel: Normal appearance of the stomach. No pathologic dilatation of small bowel or colon. Status post appendectomy. Vascular/Lymphatic: No significant atherosclerotic disease, aneurysm or dissection noted in the abdominal or pelvic vasculature. No lymphadenopathy noted in the abdomen or pelvis. Reproductive: Uterus and right ovary are unremarkable in appearance. Small rim enhancing lesion in the left ovary measuring 1.6 cm in diameter (axial image 55 of series 2), most compatible with a degenerating corpus luteum cyst. Other: No significant volume of ascites.  No pneumoperitoneum. Musculoskeletal: There are no aggressive appearing lytic or blastic lesions noted in the visualized portions of the skeleton. IMPRESSION: 1. No acute findings are noted in the abdomen or pelvis to account for the patient's symptoms. 2. There is, however, what appears to be a degenerating corpus luteum cyst in the left adnexal region which may account for the patient's symptoms. Electronically Signed   By: Trudie Reedaniel  Entrikin M.D.   On: 08/11/2018 09:18   [place] Art/ven Flow Abd Pelv Doppler  Result Date: 08/12/2018 CLINICAL DATA:  Left lower quadrant pain for a day. Corpus luteum cyst seen in the left ovary on a CT scan from August 11, 2018. EXAM: TRANSABDOMINAL AND TRANSVAGINAL ULTRASOUND OF PELVIS  DOPPLER ULTRASOUND OF OVARIES TECHNIQUE: Both transabdominal and transvaginal ultrasound examinations of the pelvis were performed. Transabdominal technique was performed for global imaging of the pelvis including uterus, ovaries, adnexal regions, and pelvic cul-de-sac. It was necessary to proceed with endovaginal exam following the transabdominal exam to visualize the endometrium and ovaries. Color and duplex Doppler ultrasound was utilized to evaluate blood flow to the ovaries. COMPARISON:  CT scan August 11, 2018 FINDINGS: Uterus Measurements: 7.3 x  3.1 x 3.9 cm = volume: 46 mL. No fibroids or other mass visualized. Endometrium Thickness: 5.4 mm.  No focal abnormality visualized. Right ovary Measurements: 2.8 x 1.5 x 1.3 cm = volume: 2.7 mL. Normal appearance/no adnexal mass. Left ovary Measurements: 3.4 x 3.0 x 2.1 cm = volume: 10.7 mL. Contains a 1.5 cm follicle Pulsed Doppler evaluation of both ovaries demonstrates normal low-resistance arterial and venous waveforms. Other findings A small amount of physiologic fluid is seen in the pelvis. IMPRESSION: 1. Normal size follicle in left ovary measuring 1.5 cm. 2. No other abnormalities identified. Electronically Signed   By: Gerome Sam III M.D   On: 08/12/2018 10:34   US Abdomen Limited Ruq  Result Date: 08/11/2018 CLINICAL DATA:  Abdominal pain. EXAM: ULTRASOUND ABDOMEN LIMITED RIGHT UPPER QUADRANT COMPARISON:  None. FINDINGS: Gallbladder: No gallstones or wall thickening visualized. No sonographic Murphy sign noted by sonographer. Common bile duct: Diameter: Normal, 2.3 mm Liver: No focal lesion identified. Within normal limits in parenchymal echogenicity. Portal vein is patent on color Doppler imaging with normal direction of blood flow towards the liver. IMPRESSION: Negative. Electronically Signed   By: Elsie Stain M.D.   On: 08/11/2018 07:35    Procedures Procedures (including critical care time)  Medications Ordered in ED Medications    sodium chloride 0.9 % bolus 1,000 mL (0 mLs Intravenous Stopped 08/12/18 1043)  ketorolac (TORADOL) 30 MG/ML injection 30 mg (30 mg Intravenous Given 08/12/18 1047)     Initial Impression / Assessment and Plan / ED Course  I have reviewed the triage vital signs and the nursing notes.  Pertinent labs & imaging results that were available during my care of the patient were reviewed by me and considered in my medical decision making (see chart for details).        Patient most likely had a ruptured luteal cyst that is causing the pain as she does not have any signs of torsion or other abnormality on ultrasound today.  I reviewed her laboratory testing that was performed yesterday and there is no significant abnormalities noted on these.  I have advised the patient to return here as needed.  Have advised her to follow-up with her GYN doctor as well.  Final Clinical Impressions(s) / ED Diagnoses   Final diagnoses:  None    ED Discharge Orders    None       Charlestine Night, PA-C 08/12/18 1153    Geoffery Lyons, MD 08/12/18 1409

## 2018-08-12 NOTE — ED Notes (Signed)
Bed: WU88 Expected date:  Expected time:  Means of arrival:  Comments: EMS 23 yo female-seen here yesterday N/V-now has lower left abd pain

## 2018-08-12 NOTE — Discharge Instructions (Signed)
Return here as needed.  Your ultrasound did not show any new findings.  I would follow-up with your GYN doctor for the ovarian cyst issue.

## 2018-09-06 ENCOUNTER — Encounter (HOSPITAL_COMMUNITY): Payer: Self-pay | Admitting: *Deleted

## 2018-09-06 ENCOUNTER — Inpatient Hospital Stay (HOSPITAL_COMMUNITY): Payer: Managed Care, Other (non HMO)

## 2018-09-06 ENCOUNTER — Inpatient Hospital Stay (HOSPITAL_COMMUNITY)
Admission: AD | Admit: 2018-09-06 | Discharge: 2018-09-06 | Disposition: A | Discharge status: Home / Self Care | Payer: Managed Care, Other (non HMO) | Attending: Family Medicine | Admitting: Family Medicine

## 2018-09-06 ENCOUNTER — Other Ambulatory Visit: Payer: Self-pay

## 2018-09-06 DIAGNOSIS — O9989 Other specified diseases and conditions complicating pregnancy, childbirth and the puerperium: Secondary | ICD-10-CM | POA: Diagnosis not present

## 2018-09-06 DIAGNOSIS — O210 Mild hyperemesis gravidarum: Secondary | ICD-10-CM | POA: Diagnosis not present

## 2018-09-06 DIAGNOSIS — O26891 Other specified pregnancy related conditions, first trimester: Secondary | ICD-10-CM

## 2018-09-06 DIAGNOSIS — R197 Diarrhea, unspecified: Secondary | ICD-10-CM | POA: Insufficient documentation

## 2018-09-06 DIAGNOSIS — Z6790 Unspecified blood type, Rh positive: Secondary | ICD-10-CM | POA: Diagnosis not present

## 2018-09-06 DIAGNOSIS — R109 Unspecified abdominal pain: Secondary | ICD-10-CM | POA: Insufficient documentation

## 2018-09-06 DIAGNOSIS — O99321 Drug use complicating pregnancy, first trimester: Secondary | ICD-10-CM | POA: Insufficient documentation

## 2018-09-06 DIAGNOSIS — F1290 Cannabis use, unspecified, uncomplicated: Secondary | ICD-10-CM | POA: Diagnosis not present

## 2018-09-06 DIAGNOSIS — Z91018 Allergy to other foods: Secondary | ICD-10-CM | POA: Diagnosis not present

## 2018-09-06 DIAGNOSIS — O99281 Endocrine, nutritional and metabolic diseases complicating pregnancy, first trimester: Secondary | ICD-10-CM | POA: Diagnosis not present

## 2018-09-06 DIAGNOSIS — O208 Other hemorrhage in early pregnancy: Secondary | ICD-10-CM | POA: Insufficient documentation

## 2018-09-06 DIAGNOSIS — E860 Dehydration: Secondary | ICD-10-CM | POA: Diagnosis not present

## 2018-09-06 DIAGNOSIS — Z79899 Other long term (current) drug therapy: Secondary | ICD-10-CM | POA: Insufficient documentation

## 2018-09-06 DIAGNOSIS — O4690 Antepartum hemorrhage, unspecified, unspecified trimester: Secondary | ICD-10-CM

## 2018-09-06 DIAGNOSIS — Z3A01 Less than 8 weeks gestation of pregnancy: Secondary | ICD-10-CM | POA: Insufficient documentation

## 2018-09-06 DIAGNOSIS — Z881 Allergy status to other antibiotic agents status: Secondary | ICD-10-CM | POA: Insufficient documentation

## 2018-09-06 DIAGNOSIS — O4691 Antepartum hemorrhage, unspecified, first trimester: Secondary | ICD-10-CM

## 2018-09-06 HISTORY — DX: Unspecified ovarian cyst, unspecified side: N83.209

## 2018-09-06 HISTORY — DX: Anemia, unspecified: D64.9

## 2018-09-06 HISTORY — DX: Anxiety disorder, unspecified: F41.9

## 2018-09-06 LAB — BASIC METABOLIC PANEL
Anion gap: 13 (ref 5–15)
BUN: 6 mg/dL (ref 6–20)
CO2: 20 mmol/L — ABNORMAL LOW (ref 22–32)
Calcium: 9.5 mg/dL (ref 8.9–10.3)
Chloride: 104 mmol/L (ref 98–111)
Creatinine, Ser: 0.72 mg/dL (ref 0.44–1.00)
GFR calc Af Amer: >60 mL/min (ref >60)
GFR calc non Af Amer: >60 mL/min (ref >60)
Glucose, Bld: 99 mg/dL (ref 70–99)
Potassium: 3.1 mmol/L — ABNORMAL LOW (ref 3.5–5.1)
Sodium: 137 mmol/L (ref 135–145)

## 2018-09-06 LAB — WET PREP, GENITAL
Sperm: NONE SEEN
Trich, Wet Prep: NONE SEEN
Yeast Wet Prep HPF POC: NONE SEEN

## 2018-09-06 LAB — CBC
HCT: 38.1 % (ref 36.0–46.0)
Hemoglobin: 13.6 g/dL (ref 12.0–15.0)
MCH: 31.9 pg (ref 26.0–34.0)
MCHC: 35.7 g/dL (ref 30.0–36.0)
MCV: 89.2 fL (ref 80.0–100.0)
Platelets: 225 10*3/uL (ref 150–400)
RBC: 4.27 MIL/uL (ref 3.87–5.11)
RDW: 12.3 % (ref 11.5–15.5)
WBC: 5.3 10*3/uL (ref 4.0–10.5)
nRBC: 0 % (ref 0.0–0.2)

## 2018-09-06 LAB — URINALYSIS, ROUTINE W REFLEX MICROSCOPIC
Bilirubin Urine: NEGATIVE
Glucose, UA: NEGATIVE mg/dL
Hgb urine dipstick: NEGATIVE
Ketones, ur: 80 mg/dL — AB
Leukocytes,Ua: NEGATIVE
Nitrite: NEGATIVE
Protein, ur: 30 mg/dL — AB
Specific Gravity, Urine: 1.027 (ref 1.005–1.030)
pH: 6 (ref 5.0–8.0)

## 2018-09-06 LAB — HCG, QUANTITATIVE, PREGNANCY: hCG, Beta Chain, Quant, S: 74759 m[IU]/mL — ABNORMAL HIGH (ref <5)

## 2018-09-06 LAB — POC URINE PREG, ED: Preg Test, Ur: POSITIVE — AB

## 2018-09-06 MED ORDER — METOCLOPRAMIDE HCL 10 MG PO TABS: 10.0000 mg | ORAL_TABLET | Freq: Four times a day (QID) | ORAL | 1 refills | Status: DC | PRN

## 2018-09-06 MED ORDER — SCOPOLAMINE 1 MG/3DAYS TD PT72
1.0000 | MEDICATED_PATCH | TRANSDERMAL | Status: DC
  Administered 2018-09-06: 1.5 mg via TRANSDERMAL
  Filled 2018-09-06: qty 1

## 2018-09-06 MED ORDER — LACTATED RINGERS IV BOLUS
1000.0000 mL | Freq: Once | INTRAVENOUS | Status: AC
  Administered 2018-09-06: 1000 mL via INTRAVENOUS

## 2018-09-06 MED ORDER — SCOPOLAMINE 1 MG/3DAYS TD PT72: 1.0000 | MEDICATED_PATCH | TRANSDERMAL | 1 refills | Status: DC

## 2018-09-06 MED ORDER — METOCLOPRAMIDE HCL 5 MG/ML IJ SOLN
10.0000 mg | Freq: Once | INTRAMUSCULAR | Status: AC
  Administered 2018-09-06: 10 mg via INTRAVENOUS
  Filled 2018-09-06: qty 2

## 2018-09-06 MED ORDER — M.V.I. ADULT IV INJ
Freq: Once | INTRAVENOUS | Status: AC
  Administered 2018-09-06: 12:00:00 via INTRAVENOUS
  Filled 2018-09-06: qty 10

## 2018-09-06 NOTE — ED Notes (Signed)
Preg test positive per mini lab

## 2018-09-06 NOTE — Discharge Instructions (Signed)
Morning Sickness  Morning sickness is when you feel sick to your stomach (nauseous) during pregnancy. You may feel sick to your stomach and throw up (vomit). You may feel sick in the morning, but you can feel this way at any time of day. Some women feel very sick to their stomach and cannot stop throwing up (hyperemesis gravidarum). Follow these instructions at home: Medicines  Take over-the-counter and prescription medicines only as told by your doctor. Do not take any medicines until you talk with your doctor about them first.  Taking multivitamins before getting pregnant can stop or lessen the harshness of morning sickness. Eating and drinking  Eat dry toast or crackers before getting out of bed.  Eat 5 or 6 small meals a day.  Eat dry and bland foods like rice and baked potatoes.  Do not eat greasy, fatty, or spicy foods.  Have someone cook for you if the smell of food causes you to feel sick or throw up.  If you feel sick to your stomach after taking prenatal vitamins, take them at night or with a snack.  Eat protein when you need a snack. Nuts, yogurt, and cheese are good choices.  Drink fluids throughout the day.  Try ginger ale made with real ginger, ginger tea made from fresh grated ginger, or ginger candies. General instructions  Do not use any products that have nicotine or tobacco in them, such as cigarettes and e-cigarettes. If you need help quitting, ask your doctor.  Use an air purifier to keep the air in your house free of smells.  Get lots of fresh air.  Try to avoid smells that make you feel sick.  Try: ? Wearing a bracelet that is used for seasickness (acupressure wristband). ? Going to a doctor who puts thin needles into certain body points (acupuncture) to improve how you feel. Contact a doctor if:  You need medicine to feel better.  You feel dizzy or light-headed.  You are losing weight. Get help right away if:  You feel very sick to your  stomach and cannot stop throwing up.  You pass out (faint).  You have very bad pain in your belly. Summary  Morning sickness is when you feel sick to your stomach (nauseous) during pregnancy.  You may feel sick in the morning, but you can feel this way at any time of day.  Making some changes to what you eat may help your symptoms go away. This information is not intended to replace advice given to you by your health care provider. Make sure you discuss any questions you have with your health care provider. Document Released: 05/12/2004 Document Revised: 05/05/2016 Document Reviewed: 05/05/2016 Elsevier Interactive Patient Education  2019 [employer]   Vaginal Bleeding During Pregnancy, First Trimester  A small amount of bleeding (spotting) from the vagina is common during early pregnancy. Sometimes the bleeding is normal and does not cause problems. At other times, though, bleeding may be a sign of something serious. Tell your doctor about any bleeding from your vagina right away. Follow these instructions at home: Activity  Follow your doctor's instructions about how active you can be.  If needed, make plans for someone to help with your normal activities.  Do not have sex or orgasms until your doctor says that this is safe. General instructions  Take over-the-counter and prescription medicines only as told by your doctor.  Watch your condition for any changes.  Write down: ? The number of pads you use  each day. ? How often you change pads. ? How soaked (saturated) your pads are.  Do not use tampons.  Do not douche.  If you pass any tissue from your vagina, save it to show to your doctor.  Keep all follow-up visits as told by your doctor. This is important. Contact a doctor if:  You have vaginal bleeding at any time while you are pregnant.  You have cramps.  You have a fever. Get help right away if:  You have very bad cramps in your back or belly  (abdomen).  You pass large clots or a lot of tissue from your vagina.  Your bleeding gets worse.  You feel light-headed.  You feel weak.  You pass out (faint).  You have chills.  You are leaking fluid from your vagina.  You have a gush of fluid from your vagina. Summary  Sometimes vaginal bleeding during pregnancy is normal and does not cause problems. At other times, bleeding may be a sign of something serious.  Tell your doctor about any bleeding from your vagina right away.  Follow your doctor's instructions about how active you can be. You may need someone to help you with your normal activities. This information is not intended to replace advice given to you by your health care provider. Make sure you discuss any questions you have with your health care provider. Document Released: 08/19/2013 Document Revised: 07/06/2016 Document Reviewed: 07/06/2016 Elsevier Interactive Patient Education  2019 [employer]

## 2018-09-06 NOTE — MAU Note (Signed)
Sent up from ED, +preg test.  +HPT yesterday, was confirmed at primary dr.  Had been seen a month ago for pain, was dx with a cyst, the next day pain was worse, went back- cyst had ruptured. Things got better, started feeling bad about 1 1/2 wks ago, started getting sick again, started feeling period cramps about a wk ago. Started spotting yesterday.

## 2018-09-06 NOTE — MAU Provider Note (Signed)
History     CSN: [id number]  Arrival date and time: 09/06/18 [id number]   First Provider Initiated Contact with Patient 09/06/18 [phone]      Chief Complaint  Patient presents with  . Abdominal Pain  . Emesis  . Vaginal Bleeding   G1 @8  wks by LMP presenting with N/V and spotting. N/V has been ongoing since she learned of pregnancy. She has tried Phenergan and Pepcid but is not helping. She cannot tolerate anything po. Reports almost 20 lb weight loss. Also reports loose stools since she is using Phenergan supp. No fevers. Had pink spotting yesterday only. No recent IC. Having intermittent mild low abd cramping, mostly on left. 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 Hx of seizures "when I am stressed", none in 1 year, no longer on meds.   OB History    Gravida  1   Para      Term      Preterm      AB      Living        SAB      TAB      Ectopic      Multiple      Live Births              Past Medical History:  Diagnosis Date  . Anemia   . Anxiety   . Infection    UTI, yeast  . Ovarian cyst   . Seizures (HCC)   . Vaginosis     Past Surgical History:  Procedure Laterality Date  . APPENDECTOMY  2008    Family History  Problem Relation Age of Onset  . Healthy Mother   . Healthy Father     Social History   Tobacco Use  . Smoking status: Never Smoker  . Smokeless tobacco: Never Used  Substance Use Topics  . Alcohol use: Never    Frequency: Never  . Drug use: Yes    Types: Marijuana    Comment: every day and a lot, last 09/05/18    Allergies:  Allergies  Allergen Reactions  . Metronidazole Other (See Comments)    Rash, had seizure after taking- saw neurologist, felt not related,  Seizures continued after stopping medication  . Pecan Nut (Diagnostic) Itching and Swelling    Patient is also allergic to walnuts.    No medications prior to admission.    Review of Systems  Constitutional: Negative for fever.  Gastrointestinal: Positive  for abdominal pain, diarrhea, nausea and vomiting.  Genitourinary: Positive for vaginal bleeding.   Physical Exam   Blood pressure 123/87, pulse 65, temperature 98.1 F (36.7 C), temperature source Oral, resp. rate 18, height 5\' 6"  (1.676 m), weight 56.2 kg, last menstrual period 07/12/2018, SpO2 100 %.  Physical Exam  Nursing note and vitals reviewed. Constitutional: She is oriented to person, place, and time. She appears well-developed and well-nourished. No distress.  HENT:  Head: Normocephalic and atraumatic.  Neck: Normal range of motion.  Cardiovascular: Normal rate.  Respiratory: Effort normal. No respiratory distress.  GI: Soft. She exhibits no distension and no mass. There is no abdominal tenderness. There is no rebound and no guarding.  Genitourinary:    Genitourinary Comments: External: no lesions or erythema Vagina: rugated, pink, moist, pink tinged white thin discharge Uterus: non enlarged, anteverted, non tender, no CMT Adnexae: no masses, + tenderness left, no tenderness right Cervix closed    Musculoskeletal: Normal range of motion.  Neurological: She is alert  and oriented to person, place, and time.  Skin: Skin is warm and dry.  Psychiatric: She has a normal mood and affect.   Results for orders placed or performed during the hospital encounter of 09/06/18 (from the past 24 hour(s))  POC Urine Pregnancy, ED (not at [facility])     Status: Abnormal   Collection Time: 09/06/18  8:37 AM  Result Value Ref Range   Preg Test, Ur POSITIVE (A) NEGATIVE  Wet prep, genital     Status: Abnormal   Collection Time: 09/06/18  9:40 AM  Result Value Ref Range   Yeast Wet Prep HPF POC NONE SEEN NONE SEEN   Trich, Wet Prep NONE SEEN NONE SEEN   Clue Cells Wet Prep HPF POC PRESENT (A) NONE SEEN   WBC, Wet Prep HPF POC FEW (A) NONE SEEN   Sperm NONE SEEN   Urinalysis, Routine w reflex microscopic     Status: Abnormal   Collection Time: 09/06/18  9:40 AM  Result Value Ref Range    Color, Urine YELLOW YELLOW   APPearance HAZY (A) CLEAR   Specific Gravity, Urine 1.027 1.005 - 1.030   pH 6.0 5.0 - 8.0   Glucose, UA NEGATIVE NEGATIVE mg/dL   Hgb urine dipstick NEGATIVE NEGATIVE   Bilirubin Urine NEGATIVE NEGATIVE   Ketones, ur 80 (A) NEGATIVE mg/dL   Protein, ur 30 (A) NEGATIVE mg/dL   Nitrite NEGATIVE NEGATIVE   Leukocytes,Ua NEGATIVE NEGATIVE   RBC / HPF 0-5 0 - 5 RBC/hpf   WBC, UA 0-5 0 - 5 WBC/hpf   Bacteria, UA RARE (A) NONE SEEN   Squamous Epithelial / LPF 11-20 0 - 5   Mucus PRESENT   hCG, quantitative, pregnancy     Status: Abnormal   Collection Time: 09/06/18  9:46 AM  Result Value Ref Range   hCG, Beta Chain, Quant, S 74,759 (H) <5 mIU/mL  CBC     Status: None   Collection Time: 09/06/18  9:46 AM  Result Value Ref Range   WBC 5.3 4.0 - 10.5 K/uL   RBC 4.27 3.87 - 5.11 MIL/uL   Hemoglobin 13.6 12.0 - 15.0 g/dL   HCT 16.1 09.6 - 04.5 %   MCV 89.2 80.0 - 100.0 fL   MCH 31.9 26.0 - 34.0 pg   MCHC 35.7 30.0 - 36.0 g/dL   RDW 40.9 81.1 - 91.4 %   Platelets 225 150 - 400 K/uL   nRBC 0.0 0.0 - 0.2 %  Basic metabolic panel     Status: Abnormal   Collection Time: 09/06/18  9:46 AM  Result Value Ref Range   Sodium 137 135 - 145 mmol/L   Potassium 3.1 (L) 3.5 - 5.1 mmol/L   Chloride 104 98 - 111 mmol/L   CO2 20 (L) 22 - 32 mmol/L   Glucose, Bld 99 70 - 99 mg/dL   BUN 6 6 - 20 mg/dL   Creatinine, Ser 7.82 0.44 - 1.00 mg/dL   Calcium 9.5 8.9 - 95.6 mg/dL   GFR calc non Af Amer >60 >60 mL/min   GFR calc Af Amer >60 >60 mL/min   Anion gap 13 5 - 15   US Ob Less Than 14 Weeks With Ob Transvaginal  Result Date: 09/06/2018 CLINICAL DATA:  Vaginal bleeding. EXAM: OBSTETRIC <14 WK [place] AND TRANSVAGINAL OB US TECHNIQUE: Both transabdominal and transvaginal ultrasound examinations were performed for complete evaluation of the gestation as well as the maternal uterus, adnexal regions, and pelvic cul-de-sac. Transvaginal  technique was performed to assess early  pregnancy. COMPARISON:  None. FINDINGS: Intrauterine gestational sac: Single Yolk sac:  Visualized. Embryo:  Visualized. Cardiac Activity: Visualized. Heart Rate: 89 bpm MSD:   mm    w     d CRL:  2.14 mm   5 w   5 d                  [facility]: May 04, 2019 Subchorionic hemorrhage:  Small subchorionic hemorrhage is noted. Maternal uterus/adnexae: Bilateral ovaries are normal. Corpus luteal cyst is identified in the left ovary. No free fluid is noted. IMPRESSION: Single live intrauterine gestation measuring to 5 weeks and 5 days. Small subchorionic hemorrhage is noted. Electronically Signed   By: Sherian Rein M.D.   On: 09/06/2018 10:26   MAU Course  Procedures Orders Placed This Encounter  Procedures  . Wet prep, genital    Standing Status:   Standing    Number of Occurrences:   1    Order Specific Question:   Patient immune status    Answer:   Normal  . US OB LESS THAN 14 WEEKS WITH OB TRANSVAGINAL    Standing Status:   Standing    Number of Occurrences:   1    Order Specific Question:   Symptom/Reason for Exam    Answer:   Vaginal bleeding in pregnancy [705036]  . hCG, quantitative, pregnancy    Standing Status:   Standing    Number of Occurrences:   1  . CBC    Standing Status:   Standing    Number of Occurrences:   1  . Basic metabolic panel    Standing Status:   Standing    Number of Occurrences:   1  . Urinalysis, Routine w reflex microscopic    Standing Status:   Standing    Number of Occurrences:   1  . Contact Isolation: Enteric    Standing Status:   Standing    Number of Occurrences:   1  . POC Urine Pregnancy, ED (not at [facility])    Standing Status:   Standing    Number of Occurrences:   1  . Discharge patient    Order Specific Question:   Discharge disposition    Answer:   01-Home or Self Care [1]    Order Specific Question:   Discharge patient date    Answer:   09/06/2018   Meds ordered this encounter  Medications  . scopolamine (TRANSDERM-SCOP) 1 MG/3DAYS 1.5 mg   . lactated ringers bolus 1,000 mL  . multivitamins adult (INFUVITE ADULT) 10 mL in lactated ringers 1,000 mL infusion  . metoCLOPramide (REGLAN) injection 10 mg  . scopolamine (TRANSDERM-SCOP) 1 MG/3DAYS    Sig: Place 1 patch (1.5 mg total) onto the skin every 3 (three) days.    Dispense:  10 patch    Refill:  1    Order Specific Question:   Supervising Provider    Answer:   Reva Bores [[id number]]  . metoCLOPramide (REGLAN) 10 MG tablet    Sig: Take 1 tablet (10 mg total) by mouth every 6 (six) hours as needed for nausea or vomiting.    Dispense:  30 tablet    Refill:  1    Order Specific Question:   Supervising Provider    Answer:   Samara Snide   MDM Labs and [place] ordered and reviewed. Feeling better after meds and fluids. No further emesis. Tolerating po.  Normal early IUP on [place] with small SCH. Pt requests info on local OBGYN to start care. Stable for discharge home.   Assessment and Plan   1. [redacted] weeks gestation of pregnancy   2. Vaginal bleeding in pregnancy   3. Blood type, Rh positive   4. Morning sickness   5. Dehydration    Discharge home Follow up at [facility] or [facility]) to start care Rx Scopolamine Rx Reglan Continue Pepcid Maintain hydration  Allergies as of 09/06/2018      Reactions   Metronidazole Other (See Comments)   Rash, had seizure after taking- saw neurologist, felt not related,  Seizures continued after stopping medication   Pecan Nut (diagnostic) Itching, Swelling   Patient is also allergic to walnuts.      Medication List    STOP taking these medications   ondansetron 8 MG disintegrating tablet Commonly known as:  ZOFRAN-ODT   traMADol 50 MG tablet Commonly known as:  ULTRAM     TAKE these medications   metoCLOPramide 10 MG tablet Commonly known as:  REGLAN Take 1 tablet (10 mg total) by mouth every 6 (six) hours as needed for nausea or vomiting.   pantoprazole 40 MG tablet Commonly known as:  PROTONIX Take 40 mg by mouth daily.    promethazine 25 MG suppository Commonly known as:  PHENERGAN Place 1 suppository (25 mg total) rectally every 6 (six) hours as needed for nausea or vomiting.   scopolamine 1 MG/3DAYS Commonly known as:  TRANSDERM-SCOP Place 1 patch (1.5 mg total) onto the skin every 3 (three) days. Start taking on:  Sep 09, 2018   Ventolin HFA 108 (90 Base) MCG/ACT inhaler Generic drug:  albuterol Take 2 puffs by mouth every 6 (six) hours as needed for wheezing.      Donette Larry, CNM 09/06/2018, 2:32 PM

## 2018-09-07 LAB — GC/CHLAMYDIA PROBE AMP (~~LOC~~) NOT AT ARMC
Chlamydia: NEGATIVE
Neisseria Gonorrhea: NEGATIVE

## 2018-10-08 LAB — OB RESULTS CONSOLE ABO/RH: RH Type: POSITIVE

## 2018-10-08 LAB — OB RESULTS CONSOLE RUBELLA ANTIBODY, IGM: Rubella: IMMUNE

## 2018-10-08 LAB — OB RESULTS CONSOLE HIV ANTIBODY (ROUTINE TESTING): HIV: NONREACTIVE

## 2018-10-08 LAB — OB RESULTS CONSOLE RPR: RPR: NONREACTIVE

## 2018-10-08 LAB — OB RESULTS CONSOLE GC/CHLAMYDIA: Gonorrhea: NEGATIVE

## 2018-10-08 LAB — OB RESULTS CONSOLE HEPATITIS B SURFACE ANTIGEN: Hepatitis B Surface Ag: NEGATIVE

## 2018-11-28 ENCOUNTER — Encounter (HOSPITAL_COMMUNITY): Payer: Self-pay

## 2018-11-28 ENCOUNTER — Inpatient Hospital Stay (HOSPITAL_COMMUNITY)
Admission: AD | Admit: 2018-11-28 | Discharge: 2018-11-28 | Disposition: A | Discharge status: Home / Self Care | Payer: Managed Care, Other (non HMO) | Attending: Obstetrics and Gynecology | Admitting: Obstetrics and Gynecology

## 2018-11-28 ENCOUNTER — Other Ambulatory Visit: Payer: Self-pay

## 2018-11-28 DIAGNOSIS — Z3A17 17 weeks gestation of pregnancy: Secondary | ICD-10-CM | POA: Diagnosis not present

## 2018-11-28 DIAGNOSIS — O26892 Other specified pregnancy related conditions, second trimester: Secondary | ICD-10-CM

## 2018-11-28 DIAGNOSIS — R102 Pelvic and perineal pain: Secondary | ICD-10-CM | POA: Insufficient documentation

## 2018-11-28 DIAGNOSIS — O219 Vomiting of pregnancy, unspecified: Secondary | ICD-10-CM | POA: Diagnosis not present

## 2018-11-28 DIAGNOSIS — N949 Unspecified condition associated with female genital organs and menstrual cycle: Secondary | ICD-10-CM

## 2018-11-28 LAB — URINALYSIS, ROUTINE W REFLEX MICROSCOPIC
Bilirubin Urine: NEGATIVE
Glucose, UA: NEGATIVE mg/dL
Hgb urine dipstick: NEGATIVE
Ketones, ur: NEGATIVE mg/dL
Nitrite: NEGATIVE
Protein, ur: NEGATIVE mg/dL
Specific Gravity, Urine: 1.004 — ABNORMAL LOW (ref 1.005–1.030)
pH: 8 (ref 5.0–8.0)

## 2018-11-28 MED ORDER — VENTOLIN HFA 108 (90 BASE) MCG/ACT IN AERS: 2.0000 | INHALATION_SPRAY | Freq: Four times a day (QID) | RESPIRATORY_TRACT | 1 refills | Status: AC | PRN

## 2018-11-28 NOTE — MAU Provider Note (Signed)
History     CSN: [id number]  Arrival date and time: 11/28/18 1135   First Provider Initiated Contact with Patient 11/28/18 1232      Chief Complaint  Patient presents with  . Pelvic Pain   HPI  Ms.  Theresa Marshall is a 23 y.o. year old G2P0 female at [redacted]w[redacted]d weeks gestation who presents to MAU reporting she vomited once on 11/27/18 and started having pelvic pain. She reports the pelvic pain has gotten worse over the last 24 hours. She reports that Tylenol has not helped. She denies VB or abnormal vaginal discharge.  Past Medical History:  Diagnosis Date  . Anemia   . Anxiety   . Infection    UTI, yeast  . Ovarian cyst   . Seizures ([place])   . Vaginosis     Past Surgical History:  Procedure Laterality Date  . APPENDECTOMY  2008    Family History  Problem Relation Age of Onset  . Healthy Mother   . Healthy Father     Social History   Tobacco Use  . Smoking status: Never Smoker  . Smokeless tobacco: Never Used  Substance Use Topics  . Alcohol use: Never    Frequency: Never  . Drug use: Yes    Types: Marijuana    Comment: every day and a lot, last 09/05/18    Allergies:  Allergies  Allergen Reactions  . Metronidazole Other (See Comments)    Rash, had seizure after taking- saw neurologist, felt not related,  Seizures continued after stopping medication  . Pecan Nut (Diagnostic) Itching and Swelling    Patient is also allergic to walnuts.    No medications prior to admission.    Review of Systems  Constitutional: Negative.   HENT: Negative.   Eyes: Negative.   Respiratory: Negative.   Cardiovascular: Negative.   Gastrointestinal: Negative.   Endocrine: Negative.   Genitourinary: Positive for pelvic pain ("gets worse with movement"). Negative for vaginal bleeding and vaginal discharge.  Musculoskeletal: Negative.   Skin: Negative.   Allergic/Immunologic: Negative.   Neurological: Negative.   Hematological: Negative.   Psychiatric/Behavioral:  Negative.    Physical Exam   Blood pressure 115/71, pulse 78, temperature 98.4 F (36.9 C), temperature source Oral, resp. rate 16, height 5\' 5"  (1.651 m), weight 58.8 kg, last menstrual period 07/12/2018, SpO2 100 %.  Physical Exam  Nursing note and vitals reviewed. Constitutional: She is oriented to person, place, and time. She appears well-developed and well-nourished.  HENT:  Head: Normocephalic and atraumatic.  Eyes: Pupils are equal, round, and reactive to light.  Neck: Normal range of motion.  Cardiovascular: Normal rate.  Respiratory: Effort normal.  GI: Soft. There is abdominal tenderness (in groin area bilaterally; L>R).  Genitourinary:    Genitourinary Comments: Dilation: Closed Effacement (%): Thick Exam by: Sunday Corn, CNM    Musculoskeletal: Normal range of motion.  Neurological: She is alert and oriented to person, place, and time. She has normal reflexes.  Skin: Skin is warm and dry.  Psychiatric: She has a normal mood and affect. Her behavior is normal. Judgment and thought content normal.    FHTs by doppler: 150 bpm  MAU Course  Procedures  MDM CCUA UCx -- pending  Results for orders placed or performed during the hospital encounter of 11/28/18 (from the past 24 hour(s))  Urinalysis, Routine w reflex microscopic     Status: Abnormal   Collection Time: 11/28/18  1:12 PM  Result Value Ref Range   Color,  Urine STRAW (A) YELLOW   APPearance CLEAR CLEAR   Specific Gravity, Urine 1.004 (L) 1.005 - 1.030   pH 8.0 5.0 - 8.0   Glucose, UA NEGATIVE NEGATIVE mg/dL   Hgb urine dipstick NEGATIVE NEGATIVE   Bilirubin Urine NEGATIVE NEGATIVE   Ketones, ur NEGATIVE NEGATIVE mg/dL   Protein, ur NEGATIVE NEGATIVE mg/dL   Nitrite NEGATIVE NEGATIVE   Leukocytes,Ua TRACE (A) NEGATIVE   RBC / HPF 0-5 0 - 5 RBC/hpf   WBC, UA 0-5 0 - 5 WBC/hpf   Bacteria, UA RARE (A) NONE SEEN   Squamous Epithelial / LPF 0-5 0 - 5    Assessment and Plan  Round ligament pain  -  Reassurance given that RLP is normal variance of this gestation of pregnancy - Demonstration of stretches and massages shown to patient with return demonstration to display understanding done - Information provided on RLP and comfort measures - Advised to request maternity support belt from her OB provider at her next appt in 2 wks.  - Will send urine for culture to r/o any urinary infection as cause of pelvic pain -- someone will call you to treat if necessary - Discharge patient - Keep scheduled OB visit with [facility] in 2 wks - Patient verbalized an understanding of the plan of care and agrees.    Raelyn Moraolitta Manish Ruggiero, MSN, CNM 11/28/2018, 12:35 PM

## 2018-11-28 NOTE — MAU Note (Signed)
Theresa Marshall is a 23 y.o. at [redacted]w[redacted]d here in MAU reporting: states yesterday she vomited and since then she has been having pelvic pain and it has gotten worse. No vaginal bleeding or abnormal discharge. Tried tylenol with no relief.  Onset of complaint: yesterday  Pain score: 8/10  Vitals:   11/28/18 1159  BP: 118/73  Pulse: 92  Resp: 16  Temp: 98.4 F (36.9 C)  SpO2: 100%     FHT:150  Lab orders placed from triage: UA, pt unable to give sample at this time

## 2018-11-28 NOTE — Discharge Instructions (Signed)
Comfort Measures for Round Ligament Pain:  Soak in a warm tub Tylenol 1000 mg by mouth every 6-8 hrs as needed for pain Slow position changes Do pelvic massages and chair stretches (as demonstrated to you) Laying on the affected (painful) side

## 2018-11-29 LAB — CULTURE, OB URINE
Culture: 40000 — AB
Special Requests: NORMAL

## 2018-12-21 ENCOUNTER — Other Ambulatory Visit: Payer: Self-pay

## 2018-12-21 ENCOUNTER — Telehealth (INDEPENDENT_AMBULATORY_CARE_PROVIDER_SITE_OTHER): Payer: Managed Care, Other (non HMO) | Admitting: Neurology

## 2018-12-21 ENCOUNTER — Encounter: Payer: Self-pay | Admitting: Neurology

## 2018-12-21 VITALS — Ht 65.0 in | Wt 145.0 lb

## 2018-12-21 DIAGNOSIS — R51 Headache: Secondary | ICD-10-CM | POA: Diagnosis not present

## 2018-12-21 DIAGNOSIS — G43009 Migraine without aura, not intractable, without status migrainosus: Secondary | ICD-10-CM

## 2018-12-21 DIAGNOSIS — O26899 Other specified pregnancy related conditions, unspecified trimester: Secondary | ICD-10-CM | POA: Diagnosis not present

## 2018-12-21 MED ORDER — MAGNESIUM 400 MG PO CAPS: ORAL_CAPSULE | ORAL | 11 refills | Status: DC

## 2018-12-21 MED ORDER — BUTALBITAL-APAP-CAFFEINE 50-325-40 MG PO TABS: ORAL_TABLET | ORAL | 5 refills | Status: AC

## 2018-12-21 NOTE — Progress Notes (Signed)
Virtual Visit via Video Note The purpose of this virtual visit is to provide medical care while limiting exposure to the novel coronavirus.    Consent was obtained for video visit:  Yes.   Answered questions that patient had about telehealth interaction:  Yes.   I discussed the limitations, risks, security and privacy concerns of performing an evaluation and management service by telemedicine. I also discussed with the patient that there may be a patient responsible charge related to this service. The patient expressed understanding and agreed to proceed.  Pt location: Home Physician Location: office Name of referring provider:  Allyn Kenner, DO I connected with Mickel Fuchs at patients initiation/request on 12/21/2018 at  2:00 PM EDT by video enabled telemedicine application and verified that I am speaking with the correct person using two identifiers. Pt MRN:  [id number] Pt DOB:  [birth year] Video Participants:  Mickel Fuchs   History of Present Illness:  This is a 23 year old right-handed G1P0 woman at [redacted] weeks gestation presenting for evaluation of seizures and worsening headaches. Seizures started around age 15 where her body would feel like deadweight, vision gets blurred, she can hear people around her unable to move. She reports "sometimes light convulsions but never grand mal." Her last seizure was in December 2019/January 2020 where she had a bad headache with light sensitivity, her vision went completely and she was unresponsive. Her mother reported a dull stare. She has left shoulder numbness before a seizure, and her left side feels weaker after.  She was also told she has them in sleep, looking like tremors, no tongue bite or incontinence. She was started on Keppra in 2014. She was having 3 or 4 seizures a day and Keppra quieted them down a little, then this was stopped in March 2015 "because they could not find out why she was having seizures" and told they were  anxiety-induced or stress seizures.   She has a history of bad migraines since 2014. She had daith piercing in 2016 and migraines got better. Headaches recurred since the start of her pregnancy, they are different than her migraines, more painful and lasting 2-3 days. There is light nausea, her only relief is closing her eyes. Pain is dull and achy in the right temporal region. There is dizziness and nausea when more intense. Tylenol does not help. She has headaches every 2-3 days. There is no neck pain, focal numbness/tinglimg/weakness. She has some back pain, no bowel/bladder dysfunction. Her sister has migraines. No family history of seizures.  She had a normal birth and early development.  There is no history of febrile convulsions, CNS infections such as meningitis/encephalitis, significant traumatic brain injury, neurosurgical procedures, or family history of seizures.   PAST MEDICAL HISTORY: Past Medical History:  Diagnosis Date  . Anemia   . Anxiety   . Infection    UTI, yeast  . Ovarian cyst   . Pregnancy   . Seizures ([place])   . Vaginosis     PAST SURGICAL HISTORY: Past Surgical History:  Procedure Laterality Date  . APPENDECTOMY  2008    MEDICATIONS: Current Outpatient Medications on File Prior to Visit  Medication Sig Dispense Refill  . Prenatal Vit-Fe Fumarate-FA (MULTIVITAMIN-PRENATAL) 27-0.8 MG TABS tablet Take 1 tablet by mouth daily at 12 noon.    . VENTOLIN HFA 108 (90 Base) MCG/ACT inhaler Inhale 2 puffs into the lungs every 6 (six) hours as needed for wheezing. 6.7 g 1   No current facility-administered  medications on file prior to visit.     ALLERGIES: Allergies  Allergen Reactions  . Metronidazole Other (See Comments)    Rash, had seizure after taking- saw neurologist, felt not related,  Seizures continued after stopping medication  . Pecan Nut (Diagnostic) Itching and Swelling    Patient is also allergic to walnuts.    FAMILY HISTORY: Family History   Problem Relation Age of Onset  . Healthy Mother   . Healthy Father      Observations/Objective:   Vitals:   12/21/18 1339  Weight: 145 lb (65.8 kg)  Height: 5\' 5"  (1.651 m)   GEN:  The patient appears stated age and is in NAD.  Neurological examination: Patient is awake, alert, oriented x 3. No aphasia or dysarthria. Intact fluency and comprehension. Remote and recent memory intact. Able to name and repeat. Cranial nerves: Extraocular movements intact with no nystagmus. No facial asymmetry. Motor: moves all extremities symmetrically, at least anti-gravity x 4. No incoordination on finger to nose testing.   Assessment and Plan:   This is a 23 year old right-handed G1P0 woman at 5622 weeks gestation presenting for evaluation of seizures and worsening headaches. 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 At this point would not restart seizure medication unless symptoms change, prior seizures suggestive of psychogenic non-epileptic events. She has migraines which quieted down in 2016, now with a different type of headache since pregnancy. MRI brain and MRV head will be ordered to assess for underlying structural abnormality. She was advised to start daily magnesium 400mg  for headache prophylaxis, and may take prn Fioricet (as long as cleared with her OB as well). She knows to minimize Fioricet to 2-3 times a week to avoid rebound headaches. Follow-up in 3-4 months, she knows to call for any changes.    Follow Up Instructions:   -I discussed the assessment and treatment plan with the patient. The patient was provided an opportunity to ask questions and all were answered. The patient agreed with the plan and demonstrated an understanding of the instructions.   The patient was advised to call back or seek an in-person evaluation if the symptoms worsen or if the condition fails to improve as  anticipated.    Van ClinesKaren M , MD

## 2018-12-28 ENCOUNTER — Other Ambulatory Visit: Payer: Self-pay

## 2018-12-28 DIAGNOSIS — O26899 Other specified pregnancy related conditions, unspecified trimester: Secondary | ICD-10-CM

## 2018-12-28 DIAGNOSIS — G43009 Migraine without aura, not intractable, without status migrainosus: Secondary | ICD-10-CM

## 2019-01-05 ENCOUNTER — Other Ambulatory Visit: Payer: Medicaid Other

## 2019-02-14 ENCOUNTER — Other Ambulatory Visit: Payer: Self-pay

## 2019-02-14 ENCOUNTER — Encounter (HOSPITAL_COMMUNITY): Payer: Self-pay

## 2019-02-14 ENCOUNTER — Inpatient Hospital Stay (HOSPITAL_COMMUNITY)
Admission: AD | Admit: 2019-02-14 | Discharge: 2019-02-14 | Disposition: A | Discharge status: Home / Self Care | Payer: Managed Care, Other (non HMO) | Attending: Obstetrics and Gynecology | Admitting: Obstetrics and Gynecology

## 2019-02-14 DIAGNOSIS — N949 Unspecified condition associated with female genital organs and menstrual cycle: Secondary | ICD-10-CM

## 2019-02-14 DIAGNOSIS — Z79899 Other long term (current) drug therapy: Secondary | ICD-10-CM | POA: Diagnosis not present

## 2019-02-14 DIAGNOSIS — Z91018 Allergy to other foods: Secondary | ICD-10-CM | POA: Insufficient documentation

## 2019-02-14 DIAGNOSIS — O23593 Infection of other part of genital tract in pregnancy, third trimester: Secondary | ICD-10-CM | POA: Diagnosis not present

## 2019-02-14 DIAGNOSIS — Z881 Allergy status to other antibiotic agents status: Secondary | ICD-10-CM | POA: Insufficient documentation

## 2019-02-14 DIAGNOSIS — N859 Noninflammatory disorder of uterus, unspecified: Secondary | ICD-10-CM

## 2019-02-14 DIAGNOSIS — O99891 Other specified diseases and conditions complicating pregnancy: Secondary | ICD-10-CM | POA: Insufficient documentation

## 2019-02-14 DIAGNOSIS — R102 Pelvic and perineal pain: Secondary | ICD-10-CM | POA: Diagnosis not present

## 2019-02-14 DIAGNOSIS — O26893 Other specified pregnancy related conditions, third trimester: Secondary | ICD-10-CM

## 2019-02-14 DIAGNOSIS — N858 Other specified noninflammatory disorders of uterus: Secondary | ICD-10-CM

## 2019-02-14 DIAGNOSIS — R109 Unspecified abdominal pain: Secondary | ICD-10-CM | POA: Diagnosis not present

## 2019-02-14 DIAGNOSIS — Z3A28 28 weeks gestation of pregnancy: Secondary | ICD-10-CM | POA: Diagnosis not present

## 2019-02-14 DIAGNOSIS — B9689 Other specified bacterial agents as the cause of diseases classified elsewhere: Secondary | ICD-10-CM | POA: Insufficient documentation

## 2019-02-14 LAB — URINALYSIS, MICROSCOPIC (REFLEX)

## 2019-02-14 LAB — URINALYSIS, ROUTINE W REFLEX MICROSCOPIC
Bilirubin Urine: NEGATIVE
Glucose, UA: NEGATIVE mg/dL
Hgb urine dipstick: NEGATIVE
Ketones, ur: NEGATIVE mg/dL
Nitrite: NEGATIVE
Protein, ur: NEGATIVE mg/dL
Specific Gravity, Urine: 1.01 (ref 1.005–1.030)
pH: 8 (ref 5.0–8.0)

## 2019-02-14 LAB — WET PREP, GENITAL
Sperm: NONE SEEN
Trich, Wet Prep: NONE SEEN
Yeast Wet Prep HPF POC: NONE SEEN

## 2019-02-14 MED ORDER — CYCLOBENZAPRINE HCL 10 MG PO TABS
10.0000 mg | ORAL_TABLET | Freq: Once | ORAL | Status: AC
  Administered 2019-02-14: 10 mg via ORAL
  Filled 2019-02-14: qty 1

## 2019-02-14 MED ORDER — CLINDAMYCIN HCL 300 MG PO CAPS: 300.0000 mg | ORAL_CAPSULE | Freq: Two times a day (BID) | ORAL | 0 refills | Status: AC

## 2019-02-14 MED ORDER — LACTATED RINGERS IV BOLUS
1000.0000 mL | Freq: Once | INTRAVENOUS | Status: AC
  Administered 2019-02-14: 1000 mL via INTRAVENOUS

## 2019-02-14 MED ORDER — COMFORT FIT MATERNITY SUPP MED MISC: 1.0000 [IU] | Freq: Every day | 0 refills | Status: DC

## 2019-02-14 NOTE — Discharge Instructions (Signed)
[facility]   [address], [place], [place] [address] Phone: [phone]  Monday     8:30AM-5PM Tuesday 8:30AM-5PM Wednesday 8:30AM-5PM Thursday 8:30AM-5PM Friday  8:30AM-5PM Saturday Closed Sunday Closed    Fetal Movement Counts Patient Name: ________________________________________________ Patient Due Date: ____________________ What is a fetal movement count?  A fetal movement count is the number of times that you feel your baby move during a certain amount of time. This may also be called a fetal kick count. A fetal movement count is recommended for every pregnant woman. You may be asked to start counting fetal movements as early as week 28 of your pregnancy. Pay attention to when your baby is most active. You may notice your baby's sleep and wake cycles. You may also notice things that make your baby move more. You should do a fetal movement count:  When your baby is normally most active.  At the same time each day. A good time to count movements is while you are resting, after having something to eat and drink. How do I count fetal movements? 1. Find a quiet, comfortable area. Sit, or lie down on your side. 2. Write down the date, the start time and stop time, and the number of movements that you felt between those two times. Take this information with you to your health care visits. 3. For 2 hours, count kicks, flutters, swishes, rolls, and jabs. You should feel at least 10 movements during 2 hours. 4. You may stop counting after you have felt 10 movements. 5. If you do not feel 10 movements in 2 hours, have something to eat and drink. Then, keep resting and counting for 1 hour. If you feel at least 4 movements during that hour, you may stop counting. Contact a health care provider if:  You feel fewer than 4 movements in 2 hours.  Your baby is not moving like he or she usually does. Date: ____________ Start time: ____________ Stop time: ____________  Movements: ____________ Date: ____________ Start time: ____________ Stop time: ____________ Movements: ____________ Date: ____________ Start time: ____________ Stop time: ____________ Movements: ____________ Date: ____________ Start time: ____________ Stop time: ____________ Movements: ____________ Date: ____________ Start time: ____________ Stop time: ____________ Movements: ____________ Date: ____________ Start time: ____________ Stop time: ____________ Movements: ____________ Date: ____________ Start time: ____________ Stop time: ____________ Movements: ____________ Date: ____________ Start time: ____________ Stop time: ____________ Movements: ____________ Date: ____________ Start time: ____________ Stop time: ____________ Movements: ____________ This information is not intended to replace advice given to you by your health care provider. Make sure you discuss any questions you have with your health care provider. Document Released: 05/04/2006 Document Revised: 04/24/2018 Document Reviewed: 05/14/2015 Elsevier Patient Education  2020 [employer] of the uterus can occur throughout pregnancy, but they are not always a sign that you are in labor. You may have practice contractions called Braxton Hicks contractions. These false labor contractions are sometimes confused with true labor. What are [place] contractions? Braxton Hicks contractions are tightening movements that occur in the muscles of the uterus before labor. Unlike true labor contractions, these contractions do not result in opening (dilation) and thinning of the cervix. Toward the end of pregnancy (32-34 weeks), Braxton Hicks contractions can happen more often and may become stronger. These contractions are sometimes difficult to tell apart from true labor because they can be very uncomfortable. You should not feel embarrassed if you go to the hospital with false labor. Sometimes, the  only  way to tell if you are in true labor is for your health care provider to look for changes in the cervix. The health care provider will do a physical exam and may monitor your contractions. If you are not in true labor, the exam should show that your cervix is not dilating and your water has not broken. If there are no other health problems associated with your pregnancy, it is completely safe for you to be sent home with false labor. You may continue to have Braxton Hicks contractions until you go into true labor. How to tell the difference between true labor and false labor True labor  Contractions last 30-70 seconds.  Contractions become very regular.  Discomfort is usually felt in the top of the uterus, and it spreads to the lower abdomen and low back.  Contractions do not go away with walking.  Contractions usually become more intense and increase in frequency.  The cervix dilates and gets thinner. False labor  Contractions are usually shorter and not as strong as true labor contractions.  Contractions are usually irregular.  Contractions are often felt in the front of the lower abdomen and in the groin.  Contractions may go away when you walk around or change positions while lying down.  Contractions get weaker and are shorter-lasting as time goes on.  The cervix usually does not dilate or become thin. Follow these instructions at home:   Take over-the-counter and prescription medicines only as told by your health care provider.  Keep up with your usual exercises and follow other instructions from your health care provider.  Eat and drink lightly if you think you are going into labor.  If Braxton Hicks contractions are making you uncomfortable: ? Change your position from lying down or resting to walking, or change from walking to resting. ? Sit and rest in a tub of warm water. ? Drink enough fluid to keep your urine pale yellow. Dehydration may cause these  contractions. ? Do slow and deep breathing several times an hour.  Keep all follow-up prenatal visits as told by your health care provider. This is important. Contact a health care provider if:  You have a fever.  You have continuous pain in your abdomen. Get help right away if:  Your contractions become stronger, more regular, and closer together.  You have fluid leaking or gushing from your vagina.  You pass blood-tinged mucus (bloody show).  You have bleeding from your vagina.  You have low back pain that you never had before.  You feel your babys head pushing down and causing pelvic pressure.  Your baby is not moving inside you as much as it used to. Summary  Contractions that occur before labor are called Braxton Hicks contractions, false labor, or practice contractions.  Braxton Hicks contractions are usually shorter, weaker, farther apart, and less regular than true labor contractions. True labor contractions usually become progressively stronger and regular, and they become more frequent.  Manage discomfort from [facility] contractions by changing position, resting in a warm bath, drinking plenty of water, or practicing deep breathing. This information is not intended to replace advice given to you by your health care provider. Make sure you discuss any questions you have with your health care provider. Document Released: 08/18/2016 Document Revised: 03/17/2017 Document Reviewed: 08/18/2016 Elsevier Patient Education  [place]. Round Ligament Pain  The round ligament is a cord of muscle and tissue that helps support the uterus. It can become  a source of pain during pregnancy if it becomes stretched or twisted as the baby grows. The pain usually begins in the second trimester (13-28 weeks) of pregnancy, and it can come and go until the baby is delivered. It is not a serious problem, and it does not cause harm to the baby. Round ligament pain is usually a  short, sharp, and pinching pain, but it can also be a dull, lingering, and aching pain. The pain is felt in the lower side of the abdomen or in the groin. It usually starts deep in the groin and moves up to the outside of the hip area. The pain may occur when you:  Suddenly change position, such as quickly going from a sitting to standing position.  Roll over in bed.  Cough or sneeze.  Do physical activity. Follow these instructions at home:   Watch your condition for any changes.  When the pain starts, relax. Then try any of these methods to help with the pain: ? Sitting down. ? Flexing your knees up to your abdomen. ? Lying on your side with one pillow under your abdomen and another pillow between your legs. ? Sitting in a warm bath for 15-20 minutes or until the pain goes away.  Take over-the-counter and prescription medicines only as told by your health care provider.  Move slowly when you sit down or stand up.  Avoid long walks if they cause pain.  Stop or reduce your physical activities if they cause pain.  Keep all follow-up visits as told by your health care provider. This is important. Contact a health care provider if:  Your pain does not go away with treatment.  You feel pain in your back that you did not have before.  Your medicine is not helping. Get help right away if:  You have a fever or chills.  You develop uterine contractions.  You have vaginal bleeding.  You have nausea or vomiting.  You have diarrhea.  You have pain when you urinate. Summary  Round ligament pain is felt in the lower abdomen or groin. It is usually a short, sharp, and pinching pain. It can also be a dull, lingering, and aching pain.  This pain usually begins in the second trimester (13-28 weeks). It occurs because the uterus is stretching with the growing baby, and it is not harmful to the baby.  You may notice the pain when you suddenly change position, when you cough or  sneeze, or during physical activity.  Relaxing, flexing your knees to your abdomen, lying on one side, or taking a warm bath may help to get rid of the pain.  Get help from your health care provider if the pain does not go away or if you have vaginal bleeding, nausea, vomiting, diarrhea, or painful urination. This information is not intended to replace advice given to you by your health care provider. Make sure you discuss any questions you have with your health care provider. Document Released: 01/12/2008 Document Revised: 09/20/2017 Document Reviewed: 09/20/2017 Elsevier Patient Education  2020 [employer].

## 2019-02-14 NOTE — MAU Note (Signed)
Pt presents to MAU with c/o lower abdominal pain and back pain that started last night and has gotten worse today. Pt denies VB and LOF. +FM

## 2019-02-14 NOTE — MAU Provider Note (Addendum)
History     CSN: [id number]  Arrival date and time: 02/14/19 1110   First Provider Initiated Contact with Patient 02/14/19 1154      Chief Complaint  Patient presents with  . Abdominal Pain   HPI  Theresa Marshall is a 23 y.o. G1P0 at [redacted]w[redacted]d who presents to MAU today with complaint of low back pain and lower pelvic pain. The back pain has been present for many days. The abdominal pain started today. The abdominal pain is making the back pain worse. She rates her pain at 6/10 now. She has not taken anything for pain. She states pain is sharp at times and then dull and aching. She denies contractions, vaginal bleeding, LOF, abnormal discharge, UTI symptoms, N/V/D or fever. She states pain is worse with ambulation, standing and change of position. She reports normal fetal movement.   OB History    Gravida  1   Para      Term      Preterm      AB      Living        SAB      TAB      Ectopic      Multiple      Live Births              Past Medical History:  Diagnosis Date  . Anemia   . Anxiety   . Infection    UTI, yeast  . Ovarian cyst   . Pregnancy   . Seizures ([place])   . Vaginosis     Past Surgical History:  Procedure Laterality Date  . APPENDECTOMY  2008    Family History  Problem Relation Age of Onset  . Healthy Mother   . Healthy Father     Social History   Tobacco Use  . Smoking status: Never Smoker  . Smokeless tobacco: Never Used  Substance Use Topics  . Alcohol use: Never    Frequency: Never  . Drug use: Not Currently    Types: Marijuana    Comment:  last use 09/05/18    Allergies:  Allergies  Allergen Reactions  . Metronidazole Other (See Comments)    Rash, had seizure after taking- saw neurologist, felt not related,  Seizures continued after stopping medication  . Pecan Nut (Diagnostic) Itching and Swelling    Patient is also allergic to walnuts.    Medications Prior to Admission  Medication Sig Dispense Refill Last  Dose  . butalbital-acetaminophen-caffeine (FIORICET) 50-325-40 MG tablet Take 1 tablet as needed for severe headache. Do not take more than 2-3 a week 10 tablet 5   . Magnesium 400 MG CAPS Take 1 capsule daily 30 capsule 11   . Prenatal Vit-Fe Fumarate-FA (MULTIVITAMIN-PRENATAL) 27-0.8 MG TABS tablet Take 1 tablet by mouth daily at 12 noon.     . VENTOLIN HFA 108 (90 Base) MCG/ACT inhaler Inhale 2 puffs into the lungs every 6 (six) hours as needed for wheezing. 6.7 g 1     Review of Systems  Constitutional: Negative for fever.  Gastrointestinal: Positive for abdominal pain. Negative for constipation, diarrhea, nausea and vomiting.  Genitourinary: Negative for dysuria, frequency, urgency, vaginal bleeding and vaginal discharge.  Musculoskeletal: Positive for back pain.   Physical Exam   Blood pressure 123/66, pulse 77, temperature 98.2 F (36.8 C), temperature source Oral, resp. rate 18, last menstrual period 07/12/2018, SpO2 100 %.  Physical Exam  Nursing note and vitals reviewed. Constitutional: She is  oriented to person, place, and time. She appears well-developed and well-nourished. No distress.  HENT:  Head: Normocephalic and atraumatic.  Cardiovascular: Normal rate.  Respiratory: Effort normal.  GI: Soft. She exhibits no distension and no mass. There is abdominal tenderness (LLQ mild tenderness to palpation). There is no rebound, no guarding and no CVA tenderness.  Genitourinary: Uterus is enlarged. Cervix exhibits no motion tenderness and no friability.    Vaginal discharge (copious thick, white discharge noted) present.     No vaginal bleeding.  No bleeding in the vagina.  Musculoskeletal:     Lumbar back: She exhibits tenderness (mild) and pain. She exhibits no swelling, no edema and no spasm.  Neurological: She is alert and oriented to person, place, and time.  Skin: Skin is warm and dry. No erythema.  Psychiatric: She has a normal mood and affect.   Dilation:  Closed Effacement (%): Thick Cervical Position: Posterior Exam by:: Magnus Sinning, PA   Results for orders placed or performed during the hospital encounter of 02/14/19 (from the past 24 hour(s))  Urinalysis, Routine w reflex microscopic     Status: Abnormal   Collection Time: 02/14/19 11:39 AM  Result Value Ref Range   Color, Urine YELLOW YELLOW   APPearance CLEAR CLEAR   Specific Gravity, Urine 1.010 1.005 - 1.030   pH 8.0 5.0 - 8.0   Glucose, UA NEGATIVE NEGATIVE mg/dL   Hgb urine dipstick NEGATIVE NEGATIVE   Bilirubin Urine NEGATIVE NEGATIVE   Ketones, ur NEGATIVE NEGATIVE mg/dL   Protein, ur NEGATIVE NEGATIVE mg/dL   Nitrite NEGATIVE NEGATIVE   Leukocytes,Ua SMALL (A) NEGATIVE  Urinalysis, Microscopic (reflex)     Status: Abnormal   Collection Time: 02/14/19 11:39 AM  Result Value Ref Range   RBC / HPF 0-5 0 - 5 RBC/hpf   WBC, UA 6-10 0 - 5 WBC/hpf   Bacteria, UA FEW (A) NONE SEEN   Squamous Epithelial / LPF 11-20 0 - 5   Mucus PRESENT   Wet prep, genital     Status: Abnormal   Collection Time: 02/14/19  1:15 PM   Specimen: Vaginal  Result Value Ref Range   Yeast Wet Prep HPF POC NONE SEEN NONE SEEN   Trich, Wet Prep NONE SEEN NONE SEEN   Clue Cells Wet Prep HPF POC PRESENT (A) NONE SEEN   WBC, Wet Prep HPF POC MODERATE (A) NONE SEEN   Sperm NONE SEEN     Fetal Monitoring: Baseline: 130 bpm Variability: moderate Accelerations: 10 x 10  Decelerations: ? At 11:39 am, then none Contractions: mild to moderate UI, and 2 contractions noted   MAU Course  Procedures None  MDM UA today  Flexeril given - patient reports improvement in pain FFN collected prior to SVE. Discarded since cervix is closed, thick Wet prep collected IV LR bolus given   Assessment and Plan  A: SIUP at [redacted]w[redacted]d Round ligament pain Uterine irritability  Bacterial vaginosis   P: Discharge home Rx for Clindamycin sent to patient's pharmacy  Rx for abdominal binder given to patient with  information for [employer] Preterm labor precautions discussed Patient advised to follow-up with [facility] Patient may return to MAU as needed or if her condition were to change or worsen   Vonzella Nipple, PA-C 02/14/2019, 2:36 PM

## 2019-03-01 ENCOUNTER — Other Ambulatory Visit: Payer: Self-pay

## 2019-03-01 ENCOUNTER — Inpatient Hospital Stay (HOSPITAL_COMMUNITY)
Admission: AD | Admit: 2019-03-01 | Discharge: 2019-03-01 | Disposition: A | Discharge status: Home / Self Care | Payer: Managed Care, Other (non HMO) | Attending: Obstetrics and Gynecology | Admitting: Obstetrics and Gynecology

## 2019-03-01 ENCOUNTER — Encounter (HOSPITAL_COMMUNITY): Payer: Self-pay | Admitting: *Deleted

## 2019-03-01 DIAGNOSIS — O4703 False labor before 37 completed weeks of gestation, third trimester: Secondary | ICD-10-CM

## 2019-03-01 DIAGNOSIS — D649 Anemia, unspecified: Secondary | ICD-10-CM | POA: Insufficient documentation

## 2019-03-01 DIAGNOSIS — O99013 Anemia complicating pregnancy, third trimester: Secondary | ICD-10-CM | POA: Insufficient documentation

## 2019-03-01 DIAGNOSIS — O26893 Other specified pregnancy related conditions, third trimester: Secondary | ICD-10-CM | POA: Insufficient documentation

## 2019-03-01 DIAGNOSIS — R109 Unspecified abdominal pain: Secondary | ICD-10-CM | POA: Insufficient documentation

## 2019-03-01 DIAGNOSIS — Z3A30 30 weeks gestation of pregnancy: Secondary | ICD-10-CM | POA: Diagnosis not present

## 2019-03-01 LAB — URINALYSIS, ROUTINE W REFLEX MICROSCOPIC
Bilirubin Urine: NEGATIVE
Glucose, UA: NEGATIVE mg/dL
Hgb urine dipstick: NEGATIVE
Ketones, ur: NEGATIVE mg/dL
Nitrite: NEGATIVE
Protein, ur: NEGATIVE mg/dL
Specific Gravity, Urine: 1.025 (ref 1.005–1.030)
pH: 6 (ref 5.0–8.0)

## 2019-03-01 LAB — AMNISURE RUPTURE OF MEMBRANE (ROM) NOT AT ARMC: Amnisure ROM: NEGATIVE

## 2019-03-01 LAB — FETAL FIBRONECTIN: Fetal Fibronectin: NEGATIVE

## 2019-03-01 MED ORDER — NIFEDIPINE 10 MG PO CAPS
10.0000 mg | ORAL_CAPSULE | ORAL | Status: AC | PRN
  Administered 2019-03-01 (×3): 10 mg via ORAL
  Filled 2019-03-01 (×3): qty 1

## 2019-03-01 MED ORDER — LACTATED RINGERS IV BOLUS
1000.0000 mL | Freq: Once | INTRAVENOUS | Status: AC
  Administered 2019-03-01: 1000 mL via INTRAVENOUS

## 2019-03-01 NOTE — MAU Provider Note (Addendum)
Chief Complaint:  Abdominal Pain   First Provider Initiated Contact with Patient 03/01/19 1402      HPI: Theresa Marshall is a 23 y.o. G1P0 at [redacted]w[redacted]d who presents to maternity admissions reporting painful cramping starting yesterday.  She reports lower abdominal pain that is intermittent cramping and radiating to her low back starting yesterday. The pain has worsened today, and the pt is tearful and breathing with some of the cramps. This morning she had small gush of fluid, enough to wet her underwear but not requiring a pad. There has been no additional leaking.  There are no other symptoms. She has not tried any treatments. She reports good fetal movement.  HPI  Past Medical History: Past Medical History:  Diagnosis Date  . Anemia   . Anxiety   . Infection    UTI, yeast  . Ovarian cyst   . Pregnancy   . Seizures ([place])   . Vaginosis     Past obstetric history: OB History  Gravida Para Term Preterm AB Living  1            SAB TAB Ectopic Multiple Live Births               # Outcome Date GA Lbr Len/2nd Weight Sex Delivery Anes PTL Lv  1 Current             Past Surgical History: Past Surgical History:  Procedure Laterality Date  . APPENDECTOMY  2008    Family History: Family History  Problem Relation Age of Onset  . Healthy Mother   . Healthy Father     Social History: Social History   Tobacco Use  . Smoking status: Never Smoker  . Smokeless tobacco: Never Used  Substance Use Topics  . Alcohol use: Never    Frequency: Never  . Drug use: Not Currently    Types: Marijuana    Comment:  last use 09/05/18    Allergies:  Allergies  Allergen Reactions  . Metronidazole Other (See Comments)    Rash, had seizure after taking- saw neurologist, felt not related,  Seizures continued after stopping medication  . Pecan Nut (Diagnostic) Itching and Swelling    Patient is also allergic to walnuts.    Meds:  Medications Prior to Admission  Medication Sig Dispense  Refill Last Dose  . butalbital-acetaminophen-caffeine (FIORICET) 50-325-40 MG tablet Take 1 tablet as needed for severe headache. Do not take more than 2-3 a week 10 tablet 5 Past Week at Unknown time  . Magnesium 400 MG CAPS Take 1 capsule daily 30 capsule 11 02/28/2019 at 1100  . Prenatal Vit-Fe Fumarate-FA (MULTIVITAMIN-PRENATAL) 27-0.8 MG TABS tablet Take 1 tablet by mouth daily at 12 noon.   Past Week at Unknown time  . VENTOLIN HFA 108 (90 Base) MCG/ACT inhaler Inhale 2 puffs into the lungs every 6 (six) hours as needed for wheezing. 6.7 g 1 02/28/2019 at 2000  . Elastic Bandages & Supports (COMFORT FIT MATERNITY SUPP MED) MISC 1 Units by Does not apply route daily. 1 each 0     ROS:  Review of Systems  Constitutional: Negative for chills, fatigue and fever.  Eyes: Negative for visual disturbance.  Respiratory: Negative for shortness of breath.   Cardiovascular: Negative for chest pain.  Gastrointestinal: Negative for abdominal pain, nausea and vomiting.  Genitourinary: Negative for difficulty urinating, dysuria, flank pain, pelvic pain, vaginal bleeding, vaginal discharge and vaginal pain.  Neurological: Negative for dizziness and headaches.  Psychiatric/Behavioral: Negative.  I have reviewed patient's Past Medical Hx, Surgical Hx, Family Hx, Social Hx, medications and allergies.   Physical Exam   Patient Vitals for the past 24 hrs:  BP Temp Temp src Pulse Resp SpO2 Height Weight  03/01/19 1600 127/67 - - (!) 104 - - - -  03/01/19 1535 129/75 - - - - - - -  03/01/19 1508 131/67 - - - - - - -  03/01/19 1302 106/71 97.9 F (36.6 C) Oral 78 20 99 % 5\' 6"  (1.676 m) 71.8 kg   Constitutional: Well-developed, well-nourished female in no acute distress.  Cardiovascular: normal rate Respiratory: normal effort GI: Abd soft, non-tender, gravid appropriate for gestational age.  MS: Extremities nontender, no edema, normal ROM Neurologic: Alert and oriented x 4.  GU: Neg  CVAT.  PELVIC EXAM: Cervix pink, visually closed, without lesion, scant white creamy discharge, vaginal walls and external genitalia normal Bimanual exam: Cervix 0/long/high, firm, anterior, neg CMT, uterus nontender, nonenlarged, adnexa without tenderness, enlargement, or mass  Dilation: Closed Effacement (%): Thick Cervical Position: Posterior Exam by:: L. Leftwich-Kirby, CNM  FHT:  Baseline 135 , moderate variability, accelerations present, no decelerations Contractions: q 4-20,  Mild to palpation   Labs: Results for orders placed or performed during the hospital encounter of 03/01/19 (from the past 24 hour(s))  Urinalysis, Routine w reflex microscopic     Status: Abnormal   Collection Time: 03/01/19  1:12 PM  Result Value Ref Range   Color, Urine YELLOW YELLOW   APPearance CLEAR CLEAR   Specific Gravity, Urine 1.025 1.005 - 1.030   pH 6.0 5.0 - 8.0   Glucose, UA NEGATIVE NEGATIVE mg/dL   Hgb urine dipstick NEGATIVE NEGATIVE   Bilirubin Urine NEGATIVE NEGATIVE   Ketones, ur NEGATIVE NEGATIVE mg/dL   Protein, ur NEGATIVE NEGATIVE mg/dL   Nitrite NEGATIVE NEGATIVE   Leukocytes,Ua TRACE (A) NEGATIVE   RBC / HPF 0-5 0 - 5 RBC/hpf   WBC, UA 11-20 0 - 5 WBC/hpf   Bacteria, UA RARE (A) NONE SEEN   Squamous Epithelial / LPF 0-5 0 - 5   Mucus PRESENT   Amnisure rupture of membrane (rom)not at [facility]     Status: None   Collection Time: 03/01/19  2:33 PM  Result Value Ref Range   Amnisure ROM NEGATIVE   Fetal fibronectin     Status: None   Collection Time: 03/01/19  2:42 PM  Result Value Ref Range   Fetal Fibronectin NEGATIVE NEGATIVE      Imaging:  No results found.  MAU Course/MDM: Orders Placed This Encounter  Procedures  . Culture, OB Urine  . Urinalysis, Routine w reflex microscopic  . Amnisure rupture of membrane (rom)not at [facility]  . Fetal fibronectin  . Insert peripheral IV  . Discharge patient    Meds ordered this encounter  Medications  . lactated ringers  bolus 1,000 mL  . NIFEdipine (PROCARDIA) capsule 10 mg     NST reviewed and reactive Pt with irregular contractions on toco, and cervix closed/thick/high so no evidence of preterm labor but pt breathing with contractions and tearful with pain. Amnisure and FFN collected and IV fluids and Procardia given. Amnisure and FFN negative and pt symptoms improved with no contractions on toco after Procardia 10 mg x 3 doses 20 minutes apart.  With improved symptoms and negative testing, no evidence of preterm labor today Note provided for pt to miss work this weekend to rest Increase PO fluids F/U in office Tuesday as  scheduled, return to MAU sooner if symptoms persist or worsen   Pt discharge with strict PTL precautions.    Assessment: 1. Threatened preterm labor, third trimester     Plan: Discharge home Labor precautions and fetal kick counts Follow-up Information    [facility]   Why: On Tuesday, 03/05/19, as scheduled, return to MAU as needed for signs of labor or emergencies. Contact information: [address] [place] [address] [phone]          Allergies as of 03/01/2019      Reactions   Metronidazole Other (See Comments)   Rash, had seizure after taking- saw neurologist, felt not related,  Seizures continued after stopping medication   Pecan Nut (diagnostic) Itching, Swelling   Patient is also allergic to walnuts.      Medication List    TAKE these medications   butalbital-acetaminophen-caffeine 50-325-40 MG tablet Commonly known as: FIORICET Take 1 tablet as needed for severe headache. Do not take more than 2-3 a week   Comfort Fit Maternity Supp Med Misc 1 Units by Does not apply route daily.   Magnesium 400 MG Caps Take 1 capsule daily   multivitamin-prenatal 27-0.8 MG Tabs tablet Take 1 tablet by mouth daily at 12 noon.   Ventolin HFA 108 (90 Base) MCG/ACT inhaler Generic drug: albuterol Inhale 2 puffs into the  lungs every 6 (six) hours as needed for wheezing.       Sharen CounterLisa Leftwich-Kirby Certified Nurse-Midwife 03/01/2019 5:19 PM

## 2019-03-01 NOTE — MAU Note (Signed)
Presents with c/o constant  lower abdominal pain that has occurred since last night.  Reports pain is radiating into back & legs.  Denies VB or LOF.  Endorses +FM earlier today.

## 2019-03-03 LAB — CULTURE, OB URINE: Culture: 60000 — AB

## 2019-03-19 ENCOUNTER — Telehealth: Payer: Self-pay | Admitting: *Deleted

## 2019-03-19 NOTE — Telephone Encounter (Signed)
A message was left, re: her new patient appointment. 

## 2019-03-24 ENCOUNTER — Inpatient Hospital Stay (HOSPITAL_COMMUNITY)
Admission: AD | Admit: 2019-03-24 | Discharge: 2019-03-24 | Disposition: A | Discharge status: Home / Self Care | Payer: Managed Care, Other (non HMO) | Attending: Obstetrics and Gynecology | Admitting: Obstetrics and Gynecology

## 2019-03-24 ENCOUNTER — Other Ambulatory Visit: Payer: Self-pay

## 2019-03-24 ENCOUNTER — Encounter (HOSPITAL_COMMUNITY): Payer: Self-pay

## 2019-03-24 DIAGNOSIS — Z881 Allergy status to other antibiotic agents status: Secondary | ICD-10-CM | POA: Diagnosis not present

## 2019-03-24 DIAGNOSIS — O4703 False labor before 37 completed weeks of gestation, third trimester: Secondary | ICD-10-CM

## 2019-03-24 DIAGNOSIS — O99013 Anemia complicating pregnancy, third trimester: Secondary | ICD-10-CM

## 2019-03-24 DIAGNOSIS — D649 Anemia, unspecified: Secondary | ICD-10-CM | POA: Diagnosis not present

## 2019-03-24 DIAGNOSIS — Z79899 Other long term (current) drug therapy: Secondary | ICD-10-CM | POA: Insufficient documentation

## 2019-03-24 DIAGNOSIS — Z91018 Allergy to other foods: Secondary | ICD-10-CM | POA: Insufficient documentation

## 2019-03-24 DIAGNOSIS — Z3A34 34 weeks gestation of pregnancy: Secondary | ICD-10-CM

## 2019-03-24 LAB — URINALYSIS, ROUTINE W REFLEX MICROSCOPIC
Bacteria, UA: NONE SEEN
Bilirubin Urine: NEGATIVE
Glucose, UA: NEGATIVE mg/dL
Hgb urine dipstick: NEGATIVE
Ketones, ur: NEGATIVE mg/dL
Nitrite: NEGATIVE
Protein, ur: NEGATIVE mg/dL
Specific Gravity, Urine: 1.019 (ref 1.005–1.030)
pH: 6 (ref 5.0–8.0)

## 2019-03-24 LAB — CBC
HCT: 29.9 % — ABNORMAL LOW (ref 36.0–46.0)
Hemoglobin: 9.7 g/dL — ABNORMAL LOW (ref 12.0–15.0)
MCH: 28.9 pg (ref 26.0–34.0)
MCHC: 32.4 g/dL (ref 30.0–36.0)
MCV: 89 fL (ref 80.0–100.0)
Platelets: 240 10*3/uL (ref 150–400)
RBC: 3.36 MIL/uL — ABNORMAL LOW (ref 3.87–5.11)
RDW: 13.5 % (ref 11.5–15.5)
WBC: 6.6 10*3/uL (ref 4.0–10.5)
nRBC: 0 % (ref 0.0–0.2)

## 2019-03-24 LAB — WET PREP, GENITAL
Sperm: NONE SEEN
Trich, Wet Prep: NONE SEEN
Yeast Wet Prep HPF POC: NONE SEEN

## 2019-03-24 MED ORDER — LACTATED RINGERS IV BOLUS
1000.0000 mL | Freq: Once | INTRAVENOUS | Status: AC
  Administered 2019-03-24: 1000 mL via INTRAVENOUS

## 2019-03-24 MED ORDER — POLYSACCHARIDE IRON COMPLEX 150 MG PO CAPS: 150.0000 mg | ORAL_CAPSULE | Freq: Every day | ORAL | 1 refills | Status: DC

## 2019-03-24 MED ORDER — NIFEDIPINE 10 MG PO CAPS
10.0000 mg | ORAL_CAPSULE | ORAL | Status: AC
  Administered 2019-03-24 (×2): 10 mg via ORAL
  Filled 2019-03-24 (×2): qty 1

## 2019-03-24 NOTE — MAU Provider Note (Addendum)
History     CSN: [id number]  Arrival date and time: 03/24/19 0741   First Provider Initiated Contact with Patient 03/24/19 [phone]      Chief Complaint  Patient presents with  . Contractions  . Vaginal Bleeding   23 y.o. G1 @34 .5 wks presenting for ctx. Reports feeling some cramping last night and had spotting when she wiped but was able to sleep. She woke to increased pain and ctx q2-3 min. Rates pain 6/10. Has not tried anything for it. No LOF. +FM. No recent IC. Denies urinary sx. No GI sx. She is eating and drinking well. Denies recent falls or injury.   OB History    Gravida  1   Para      Term      Preterm      AB      Living        SAB      TAB      Ectopic      Multiple      Live Births              Past Medical History:  Diagnosis Date  . Anemia   . Anxiety   . Infection    UTI, yeast  . Ovarian cyst   . Pregnancy   . Seizures ([place])   . Vaginosis     Past Surgical History:  Procedure Laterality Date  . APPENDECTOMY  2008    Family History  Problem Relation Age of Onset  . Healthy Mother   . Healthy Father     Social History   Tobacco Use  . Smoking status: Never Smoker  . Smokeless tobacco: Never Used  Substance Use Topics  . Alcohol use: Never    Frequency: Never  . Drug use: Not Currently    Types: Marijuana    Comment:  last use 09/05/18    Allergies:  Allergies  Allergen Reactions  . Metronidazole Other (See Comments)    Rash, had seizure after taking- saw neurologist, felt not related,  Seizures continued after stopping medication  . Pecan Nut (Diagnostic) Itching and Swelling    Patient is also allergic to walnuts.    Medications Prior to Admission  Medication Sig Dispense Refill Last Dose  . butalbital-acetaminophen-caffeine (FIORICET) 50-325-40 MG tablet Take 1 tablet as needed for severe headache. Do not take more than 2-3 a week 10 tablet 5   . Elastic Bandages & Supports (COMFORT FIT MATERNITY SUPP MED)  MISC 1 Units by Does not apply route daily. 1 each 0   . Magnesium 400 MG CAPS Take 1 capsule daily 30 capsule 11   . Prenatal Vit-Fe Fumarate-FA (MULTIVITAMIN-PRENATAL) 27-0.8 MG TABS tablet Take 1 tablet by mouth daily at 12 noon.     . VENTOLIN HFA 108 (90 Base) MCG/ACT inhaler Inhale 2 puffs into the lungs every 6 (six) hours as needed for wheezing. 6.7 g 1     Review of Systems  Constitutional: Negative for chills and fever.  Gastrointestinal: Positive for abdominal pain (ctx). Negative for constipation, diarrhea, nausea and vomiting.  Genitourinary: Positive for vaginal bleeding. Negative for dysuria, frequency, hematuria, urgency and vaginal discharge.   Physical Exam   Blood pressure 120/69, pulse 89, temperature 97.9 F (36.6 C), temperature source Oral, resp. rate 20, height 5\' 6"  (1.676 m), weight 75.4 kg, last menstrual period 07/12/2018, SpO2 100 %.  Physical Exam  Nursing note and vitals reviewed. Constitutional: She is oriented to person, place, and time.  She appears well-developed and well-nourished. No distress.  HENT:  Head: Normocephalic and atraumatic.  Neck: Normal range of motion.  Cardiovascular: Normal rate.  Respiratory: Effort normal. No respiratory distress.  GI: Soft. She exhibits no distension. There is no abdominal tenderness.  gravid  Genitourinary:    Genitourinary Comments: SVE closed/thick NO trace of blood   Musculoskeletal: Normal range of motion.  Neurological: She is alert and oriented to person, place, and time.  Skin: Skin is warm and dry.  Psychiatric: She has a normal mood and affect.  EFM: 140 bpm, mod variability, + accels, no decels Toco: ?UI Vtx by bedside US  Results for orders placed or performed during the hospital encounter of 03/24/19 (from the past 24 hour(s))  Wet prep, genital     Status: Abnormal   Collection Time: 03/24/19  8:15 AM   Specimen: Cervix  Result Value Ref Range   Yeast Wet Prep HPF POC NONE SEEN NONE SEEN    Trich, Wet Prep NONE SEEN NONE SEEN   Clue Cells Wet Prep HPF POC PRESENT (A) NONE SEEN   WBC, Wet Prep HPF POC FEW (A) NONE SEEN   Sperm NONE SEEN   Urinalysis, Routine w reflex microscopic     Status: Abnormal   Collection Time: 03/24/19  8:40 AM  Result Value Ref Range   Color, Urine YELLOW YELLOW   APPearance CLEAR CLEAR   Specific Gravity, Urine 1.019 1.005 - 1.030   pH 6.0 5.0 - 8.0   Glucose, UA NEGATIVE NEGATIVE mg/dL   Hgb urine dipstick NEGATIVE NEGATIVE   Bilirubin Urine NEGATIVE NEGATIVE   Ketones, ur NEGATIVE NEGATIVE mg/dL   Protein, ur NEGATIVE NEGATIVE mg/dL   Nitrite NEGATIVE NEGATIVE   Leukocytes,Ua TRACE (A) NEGATIVE   RBC / HPF 0-5 0 - 5 RBC/hpf   WBC, UA 21-50 0 - 5 WBC/hpf   Bacteria, UA NONE SEEN NONE SEEN   Squamous Epithelial / LPF 0-5 0 - 5   Mucus PRESENT   CBC     Status: Abnormal   Collection Time: 03/24/19  8:40 AM  Result Value Ref Range   WBC 6.6 4.0 - 10.5 K/uL   RBC 3.36 (L) 3.87 - 5.11 MIL/uL   Hemoglobin 9.7 (L) 12.0 - 15.0 g/dL   HCT 48.1 (L) 85.6 - 31.4 %   MCV 89.0 80.0 - 100.0 fL   MCH 28.9 26.0 - 34.0 pg   MCHC 32.4 30.0 - 36.0 g/dL   RDW 97.0 26.3 - 78.5 %   Platelets 240 150 - 400 K/uL   nRBC 0.0 0.0 - 0.2 %   MAU Course  Procedures Meds ordered this encounter  Medications  . lactated ringers bolus 1,000 mL  . NIFEdipine (PROCARDIA) capsule 10 mg  . iron polysaccharides (NIFEREX) 150 MG capsule    Sig: Take 1 capsule (150 mg total) by mouth daily.    Dispense:  60 capsule    Refill:  1    Order Specific Question:   Supervising Provider    Answer:   Jaynie Collins A [[id number]]   MDM Chart review: pregnancy complicated by Asthma, Seizure dz, MJ use. Labs ordered and reviewed. Feeling better after IVF and Procardia x2. No ctx noted on toco or palpated. WBCs on UA but pt asymptomatic, will send UC. Found to be anemic, pt reports she just started take Fe supplement, encouraged Fe rich foods. Stable for discharge home.    Assessment and Plan   1. [redacted] weeks gestation of  pregnancy   2. Anemia during pregnancy in third trimester   3. Preterm uterine contractions, antepartum, third trimester    [facility] PTL precautions  Allergies as of 03/24/2019      Reactions   Metronidazole Other (See Comments)   Rash, had seizure after taking- saw neurologist, felt not related,  Seizures continued after stopping medication   Pecan Nut (diagnostic) Itching, Swelling   Patient is also allergic to walnuts.      Medication List    TAKE these medications   butalbital-acetaminophen-caffeine 50-325-40 MG tablet Commonly known as: FIORICET Take 1 tablet as needed for severe headache. Do not take more than 2-3 a week   Comfort Fit Maternity Supp Med Misc 1 Units by Does not apply route daily.   iron polysaccharides 150 MG capsule Commonly known as: NIFEREX Take 1 capsule (150 mg total) by mouth daily.   Magnesium 400 MG Caps Take 1 capsule daily   multivitamin-prenatal 27-0.8 MG Tabs tablet Take 1 tablet by mouth daily at 12 noon.   Ventolin HFA 108 (90 Base) MCG/ACT inhaler Generic drug: albuterol Inhale 2 puffs into the lungs every 6 (six) hours as needed for wheezing.      Donette LarryMelanie Filicia Scogin, CNM 03/24/2019, 9:58 AM

## 2019-03-24 NOTE — MAU Note (Signed)
Theresa Marshall is a 23 y.o. at [redacted]w[redacted]d here in MAU reporting: contractions since last night. States she has not been timing the contractions but they feel frequent. Having a little bit of spotting, states just on toilet paper. No LOF. +FM  Onset of complaint: last night  Pain score: 9/10  Vitals:   03/24/19 0753  BP: 121/75  Pulse: 89  Resp: 20  Temp: 97.9 F (36.6 C)  SpO2: 100%     FHT: +FM  Lab orders placed from triage: UA, unable to give sample at this time

## 2019-03-24 NOTE — Discharge Instructions (Signed)
Braxton Hicks Contractions °Contractions of the uterus can occur throughout pregnancy, but they are not always a sign that you are in labor. You may have practice contractions called Braxton Hicks contractions. These false labor contractions are sometimes confused with true labor. °What are Braxton Hicks contractions? °Braxton Hicks contractions are tightening movements that occur in the muscles of the uterus before labor. Unlike true labor contractions, these contractions do not result in opening (dilation) and thinning of the cervix. Toward the end of pregnancy (32-34 weeks), Braxton Hicks contractions can happen more often and may become stronger. These contractions are sometimes difficult to tell apart from true labor because they can be very uncomfortable. You should not feel embarrassed if you go to the hospital with false labor. °Sometimes, the only way to tell if you are in true labor is for your health care provider to look for changes in the cervix. The health care provider will do a physical exam and may monitor your contractions. If you are not in true labor, the exam should show that your cervix is not dilating and your water has not broken. °If there are no other health problems associated with your pregnancy, it is completely safe for you to be sent home with false labor. You may continue to have Braxton Hicks contractions until you go into true labor. °How to tell the difference between true labor and false labor °True labor °· Contractions last 30-70 seconds. °· Contractions become very regular. °· Discomfort is usually felt in the top of the uterus, and it spreads to the lower abdomen and low back. °· Contractions do not go away with walking. °· Contractions usually become more intense and increase in frequency. °· The cervix dilates and gets thinner. °False labor °· Contractions are usually shorter and not as strong as true labor contractions. °· Contractions are usually irregular. °· Contractions  are often felt in the front of the lower abdomen and in the groin. °· Contractions may go away when you walk around or change positions while lying down. °· Contractions get weaker and are shorter-lasting as time goes on. °· The cervix usually does not dilate or become thin. °Follow these instructions at home: ° °· Take over-the-counter and prescription medicines only as told by your health care provider. °· Keep up with your usual exercises and follow other instructions from your health care provider. °· Eat and drink lightly if you think you are going into labor. °· If Braxton Hicks contractions are making you uncomfortable: °? Change your position from lying down or resting to walking, or change from walking to resting. °? Sit and rest in a tub of warm water. °? Drink enough fluid to keep your urine pale yellow. Dehydration may cause these contractions. °? Do slow and deep breathing several times an hour. °· Keep all follow-up prenatal visits as told by your health care provider. This is important. °Contact a health care provider if: °· You have a fever. °· You have continuous pain in your abdomen. °Get help right away if: °· Your contractions become stronger, more regular, and closer together. °· You have fluid leaking or gushing from your vagina. °· You pass blood-tinged mucus (bloody show). °· You have bleeding from your vagina. °· You have low back pain that you never had before. °· You feel your baby’s head pushing down and causing pelvic pressure. °· Your baby is not moving inside you as much as it used to. °Summary °· Contractions that occur before labor are   called Braxton Hicks contractions, false labor, or practice contractions.  Braxton Hicks contractions are usually shorter, weaker, farther apart, and less regular than true labor contractions. True labor contractions usually become progressively stronger and regular, and they become more frequent.  Manage discomfort from [facility] contractions  by changing position, resting in a warm bath, drinking plenty of water, or practicing deep breathing. This information is not intended to replace advice given to you by your health care provider. Make sure you discuss any questions you have with your health care provider. Document Released: 08/18/2016 Document Revised: 03/17/2017 Document Reviewed: 08/18/2016 Elsevier Patient Education  2020 [employer].   Pregnancy and Anemia  Anemia is a condition in which the concentration of red blood cells, or hemoglobin, in the blood is below normal. Hemoglobin is a substance in red blood cells that carries oxygen to the tissues of the body. Anemia results when enough oxygen does not reach these tissues. Anemia is common during pregnancy because the woman's body needs more blood volume and blood cells to provide nutrition to the fetus. The fetus needs iron and folic acid as it is developing. Your body may not produce enough red blood cells because of this. Also, during pregnancy, the liquid part of the blood (plasma) increases by about 30-50%, and the red blood cells increase by only 20%. This lowers the concentration of the red blood cells and creates a natural anemia-like situation. What are the causes? The most common cause of anemia during pregnancy is not having enough iron in the body to make red blood cells (iron deficiency anemia). Other causes may include:  Folic acid deficiency.  Vitamin B12 deficiency.  Certain prescription or over-the-counter medicines.  Certain medical conditions or infections that destroy red blood cells.  A low platelet count and bleeding caused by antibodies that go through the placenta to the fetus from the mothers blood. What are the signs or symptoms? Mild anemia may not be noticeable. If it becomes severe, symptoms may include:  Feeling tired (fatigue).  Shortness of breath, especially during activity.  Weakness.  Fainting.  Pale looking  skin.  Headaches.  A fast or irregular heartbeat (palpitations).  Dizziness. How is this diagnosed? This condition may be diagnosed based on:  Your medical history and a physical exam.  Blood tests. How is this treated? Treatment for anemia during pregnancy depends on the cause of the anemia. Treatment can include:  Dietary changes.  Supplements of iron, vitamin S97, or folic acid.  A blood transfusion. This may be needed if anemia is severe.  Hospitalization. This may be needed if there is a lot of blood loss or severe anemia. Follow these instructions at home:  Follow recommendations from your dietitian or health care provider about changing your diet.  Increase your vitamin C intake. This will help the stomach absorb more iron. Some foods that are high in vitamin C include: ? Oranges. ? Peppers. ? Tomatoes. ? Mangoes.  Eat a diet rich in iron. This would include foods such as: ? Liver. ? Beef. ? Eggs. ? Whole grains. ? Spinach. ? Dried fruit.  Take iron and vitamins as told by your health care provider.  Eat green leafy vegetables. These are a good source of folic acid.  Keep all follow-up visits as told by your health care provider. This is important. Contact a health care provider if:  You have frequent or lasting headaches.  You look pale.  You bruise easily. Get help right away if:  You have extreme weakness, shortness of breath, or chest pain.  You become dizzy or have trouble concentrating.  You have heavy vaginal bleeding.  You develop a rash.  You have bloody or black, tarry stools.  You faint.  You vomit up blood.  You vomit repeatedly.  You have abdominal pain.  You have a fever.  You are dehydrated. Summary  Anemia is a condition in which the concentration of red blood cells or hemoglobin in the blood is below normal.  Anemia is common during pregnancy because the woman's body needs more blood volume and blood cells to  provide nutrition to the fetus.  The most common cause of anemia during pregnancy is not having enough iron in the body to make red blood cells (iron deficiency anemia).  Mild anemia may not be noticeable. If it becomes severe, symptoms may include feeling tired and weak. This information is not intended to replace advice given to you by your health care provider. Make sure you discuss any questions you have with your health care provider. Document Released: 04/01/2000 Document Revised: 07/27/2018 Document Reviewed: 05/10/2016 Elsevier Patient Education  2020 [employer].

## 2019-03-25 LAB — CULTURE, OB URINE: Culture: <10000 — AB

## 2019-03-25 LAB — GC/CHLAMYDIA PROBE AMP (~~LOC~~) NOT AT ARMC
Chlamydia: NEGATIVE
Comment: NEGATIVE
Comment: NORMAL
Neisseria Gonorrhea: NEGATIVE

## 2019-03-28 ENCOUNTER — Telehealth: Payer: Self-pay | Admitting: *Deleted

## 2019-03-28 NOTE — Telephone Encounter (Signed)
A message was left, re: her new patient appointment. 

## 2019-04-09 ENCOUNTER — Other Ambulatory Visit: Payer: Self-pay

## 2019-04-09 ENCOUNTER — Encounter: Payer: Self-pay | Admitting: Neurology

## 2019-04-09 ENCOUNTER — Encounter (HOSPITAL_COMMUNITY): Payer: Self-pay | Admitting: Obstetrics and Gynecology

## 2019-04-09 ENCOUNTER — Inpatient Hospital Stay (HOSPITAL_COMMUNITY)
Admission: AD | Admit: 2019-04-09 | Discharge: 2019-04-09 | Disposition: A | Discharge status: Home / Self Care | Payer: Managed Care, Other (non HMO) | Attending: Obstetrics and Gynecology | Admitting: Obstetrics and Gynecology

## 2019-04-09 DIAGNOSIS — Z3A37 37 weeks gestation of pregnancy: Secondary | ICD-10-CM | POA: Insufficient documentation

## 2019-04-09 DIAGNOSIS — Z3493 Encounter for supervision of normal pregnancy, unspecified, third trimester: Secondary | ICD-10-CM | POA: Insufficient documentation

## 2019-04-09 DIAGNOSIS — Z3689 Encounter for other specified antenatal screening: Secondary | ICD-10-CM

## 2019-04-09 NOTE — Discharge Instructions (Signed)

## 2019-04-09 NOTE — MAU Note (Signed)
.  Theresa Marshall is a 23 y.o. at [redacted]w[redacted]d here in MAU reporting: ctx starting last night around 1900 and leaking of fluid around 2000. She states her ctx are 8 minutes apart at this time. No VB and + fetal; movement. Pain score: 7    FHT:149 Lab orders placed from triage:

## 2019-04-09 NOTE — MAU Provider Note (Signed)
None     S: Ms. ANDRENA MARGERUM is a 23 y.o. G1P0 at [redacted]w[redacted]d  who presents to MAU today complaining of leaking of fluid since last night 04/08/2019 at 8pm. She denies vaginal bleeding. She endorses contractions. She reports normal fetal movement.  She is remote from intercourse.  O: BP 127/83 (BP Location: Right Arm)   Pulse 79   Temp 98 F (36.7 C) (Oral)   Resp 17   Ht 5\' 6"  (1.676 m)   Wt 75.3 kg   LMP 07/12/2018   SpO2 100%   BMI 26.79 kg/m  GENERAL: Well-developed, well-nourished female in no acute distress.  HEAD: Normocephalic, atraumatic.  CHEST: Normal effort of breathing, regular heart rate ABDOMEN: Soft, nontender, gravid PELVIC: Normal external female genitalia. Vagina is pink and rugated. Cervix with normal contour, no lesions. Normal discharge.  Negative pooling.   Cervical exam: Closed/thick/posterior    Fetal Monitoring: Baseline: 140 Variability: Moderate Accelerations: Pos 15 x 15 Decelerations: None Contractions: Irregular q 2-6 min, palpate mild  A: Intact amniotic sac (negative pooling, negative gush with Valsalva, negative fern) [facility] at [redacted]w[redacted]d   P: Discharge home in stable condition  F/U: Patient has appointment at [facility] tomorrow 04/10/2019  Mallie Snooks, CNM 04/09/2019 1:14 PM

## 2019-04-10 LAB — OB RESULTS CONSOLE GBS: GBS: NEGATIVE

## 2019-04-15 ENCOUNTER — Inpatient Hospital Stay (HOSPITAL_COMMUNITY)
Admission: AD | Admit: 2019-04-15 | Discharge: 2019-04-16 | Disposition: A | Discharge status: Home / Self Care | Payer: Managed Care, Other (non HMO) | Attending: Obstetrics & Gynecology | Admitting: Obstetrics & Gynecology

## 2019-04-15 ENCOUNTER — Other Ambulatory Visit: Payer: Self-pay

## 2019-04-15 DIAGNOSIS — O479 False labor, unspecified: Secondary | ICD-10-CM

## 2019-04-15 DIAGNOSIS — Z3689 Encounter for other specified antenatal screening: Secondary | ICD-10-CM | POA: Diagnosis not present

## 2019-04-15 DIAGNOSIS — O471 False labor at or after 37 completed weeks of gestation: Secondary | ICD-10-CM | POA: Insufficient documentation

## 2019-04-15 DIAGNOSIS — O99413 Diseases of the circulatory system complicating pregnancy, third trimester: Secondary | ICD-10-CM | POA: Diagnosis not present

## 2019-04-15 DIAGNOSIS — Z3A38 38 weeks gestation of pregnancy: Secondary | ICD-10-CM | POA: Diagnosis not present

## 2019-04-15 DIAGNOSIS — Z3A37 37 weeks gestation of pregnancy: Secondary | ICD-10-CM | POA: Insufficient documentation

## 2019-04-15 DIAGNOSIS — R030 Elevated blood-pressure reading, without diagnosis of hypertension: Secondary | ICD-10-CM | POA: Diagnosis not present

## 2019-04-15 LAB — CBC
HCT: 32.9 % — ABNORMAL LOW (ref 36.0–46.0)
Hemoglobin: 10.6 g/dL — ABNORMAL LOW (ref 12.0–15.0)
MCH: 27.7 pg (ref 26.0–34.0)
MCHC: 32.2 g/dL (ref 30.0–36.0)
MCV: 85.9 fL (ref 80.0–100.0)
Platelets: 274 10*3/uL (ref 150–400)
RBC: 3.83 MIL/uL — ABNORMAL LOW (ref 3.87–5.11)
RDW: 14.7 % (ref 11.5–15.5)
WBC: 9.1 10*3/uL (ref 4.0–10.5)
nRBC: 0 % (ref 0.0–0.2)

## 2019-04-15 MED ORDER — BUTALBITAL-APAP-CAFFEINE 50-325-40 MG PO TABS
1.0000 | ORAL_TABLET | Freq: Once | ORAL | Status: AC
  Administered 2019-04-15: 1 via ORAL
  Filled 2019-04-15: qty 1

## 2019-04-15 MED ORDER — BUTALBITAL-APAP-CAFFEINE 50-325-40 MG PO TABS: 2.0000 | ORAL_TABLET | Freq: Once | ORAL | Status: DC

## 2019-04-15 NOTE — MAU Note (Signed)
Started contracting last night, became intense about 3 hrs ago.  Non-stop and getting much worse.  Denies bleeding or leaking. Was closed last wk.  Been throwing up and having diarrhea- these started about 3 hrs ago.

## 2019-04-15 NOTE — Discharge Instructions (Signed)
Reasons to return to MAU:  1.  Contractions are  5 minutes apart or less, each last 1 minute, these have been going on for 1-2 hours, and you cannot walk or talk during them 2.  You have a large gush of fluid, or a trickle of fluid that will not stop and you have to wear a pad 3.  You have bleeding that is bright red, heavier than spotting--like menstrual bleeding (spotting can be normal in early labor or after a check of your cervix) 4.  You do not feel the baby moving like he/she normally does   [employer] Contractions of the uterus can occur throughout pregnancy, but they are not always a sign that you are in labor. You may have practice contractions called Braxton Hicks contractions. These false labor contractions are sometimes confused with true labor. What are Theresa Marshall contractions? Braxton Hicks contractions are tightening movements that occur in the muscles of the uterus before labor. Unlike true labor contractions, these contractions do not result in opening (dilation) and thinning of the cervix. Toward the end of pregnancy (32-34 weeks), Braxton Hicks contractions can happen more often and may become stronger. These contractions are sometimes difficult to tell apart from true labor because they can be very uncomfortable. You should not feel embarrassed if you go to the hospital with false labor. Sometimes, the only way to tell if you are in true labor is for your health care provider to look for changes in the cervix. The health care provider will do a physical exam and may monitor your contractions. If you are not in true labor, the exam should show that your cervix is not dilating and your water has not broken. If there are no other health problems associated with your pregnancy, it is completely safe for you to be sent home with false labor. You may continue to have Braxton Hicks contractions until you go into true labor. How to tell the difference between true labor  and false labor True labor  Contractions last 30-70 seconds.  Contractions become very regular.  Discomfort is usually felt in the top of the uterus, and it spreads to the lower abdomen and low back.  Contractions do not go away with walking.  Contractions usually become more intense and increase in frequency.  The cervix dilates and gets thinner. False labor  Contractions are usually shorter and not as strong as true labor contractions.  Contractions are usually irregular.  Contractions are often felt in the front of the lower abdomen and in the groin.  Contractions may go away when you walk around or change positions while lying down.  Contractions get weaker and are shorter-lasting as time goes on.  The cervix usually does not dilate or become thin. Follow these instructions at home:   Take over-the-counter and prescription medicines only as told by your health care provider.  Keep up with your usual exercises and follow other instructions from your health care provider.  Eat and drink lightly if you think you are going into labor.  If Braxton Hicks contractions are making you uncomfortable: ? Change your position from lying down or resting to walking, or change from walking to resting. ? Sit and rest in a tub of warm water. ? Drink enough fluid to keep your urine pale yellow. Dehydration may cause these contractions. ? Do slow and deep breathing several times an hour.  Keep all follow-up prenatal visits as told by your health care provider. This is  important. Contact a health care provider if:  You have a fever.  You have continuous pain in your abdomen. Get help right away if:  Your contractions become stronger, more regular, and closer together.  You have fluid leaking or gushing from your vagina.  You pass blood-tinged mucus (bloody show).  You have bleeding from your vagina.  You have low back pain that you never had before.  You feel your baby's  head pushing down and causing pelvic pressure.  Your baby is not moving inside you as much as it used to. Summary  Contractions that occur before labor are called Braxton Hicks contractions, false labor, or practice contractions.  Braxton Hicks contractions are usually shorter, weaker, farther apart, and less regular than true labor contractions. True labor contractions usually become progressively stronger and regular, and they become more frequent.  Manage discomfort from [facility] contractions by changing position, resting in a warm bath, drinking plenty of water, or practicing deep breathing. This information is not intended to replace advice given to you by your health care provider. Make sure you discuss any questions you have with your health care provider. Document Released: 08/18/2016 Document Revised: 03/17/2017 Document Reviewed: 08/18/2016 Elsevier Patient Education  2020 [employer].

## 2019-04-15 NOTE — MAU Provider Note (Signed)
S: [name] is a 23 y.o. G1P0 at [redacted]w[redacted]d  who presents to MAU today for labor evaluation.     Cervical exam by RN:  Dilation: 1 Effacement (%): Thick Cervical Position: Posterior Station: -2 Presentation: Vertex Exam by:: Suezanne Jacquet, RN  Fetal Monitoring: Baseline: 145 Variability: moderate Accelerations: present  Decelerations: few variables  Contractions: 2-4 minutes/ mild by palpation   MDM Discussed patient with RN. NST reviewed.   A: [facility] at [redacted]w[redacted]d  False labor  P: Discharge home Labor precautions and kick counts included in AVS Patient to follow-up with [facility] as scheduled  Patient may return to MAU as needed or when in labor   Sharyon Cable, CNM 04/15/2019 10:15 PM  After discharge - received call by RN reporting that BP was elevated  PEC labs ordered  Patient evaluated - patient reports HA, denies vision changes or RUQ pain  Patient reports hx of migraines that she usually takes Fioricet for - ordered fioricet   Patient Vitals for the past 24 hrs:  BP Temp Temp src Pulse Resp SpO2 Height Weight  04/16/19 0045 128/82 97.7 F (36.5 C) Oral 81 18 98 % -- --  04/16/19 0030 (!) 108/49 -- -- 75 -- -- -- --  04/16/19 0015 (!) 118/55 -- -- 79 -- -- -- --  04/16/19 0000 (!) 116/56 -- -- 82 -- -- -- --  04/15/19 2345 114/60 -- -- 82 -- -- -- --  04/15/19 2339 131/63 -- -- 75 -- -- -- --  04/15/19 2234 (!) 150/71 97.8 F (36.6 C) Oral 75 20 100 % -- --  04/15/19 2226 (!) 141/87 98.1 F (36.7 C) -- -- -- -- -- --  04/15/19 2225 -- -- -- -- 20 -- -- --  04/15/19 1910 123/61 98.2 F (36.8 C) Oral 90 20 98 % -- --  04/15/19 1859 127/72 98.3 F (36.8 C) Oral 85 20 100 % 5\' 6"  (1.676 m) 75.8 kg   Reassessment of HA - patient reports HA is better after treatment with Fioricet and now that she is eating  PEC labs negative   Patient has prenatal appointment today in office at 2:45pm. Follow up as scheduled in the office for BP check. PEC precautions  discussed. No cervical change prior to discharge home. Pt stable at time of discharge.   Results for orders placed or performed during the hospital encounter of 04/15/19 (from the past 24 hour(s))  Protein / creatinine ratio, urine     Status: None   Collection Time: 04/15/19 10:31 PM  Result Value Ref Range   Creatinine, Urine 105.67 mg/dL   Total Protein, Urine 15 mg/dL   Protein Creatinine Ratio 0.14 0.00 - 0.15 mg/mg[Cre]  CBC     Status: Abnormal   Collection Time: 04/15/19 10:48 PM  Result Value Ref Range   WBC 9.1 4.0 - 10.5 K/uL   RBC 3.83 (L) 3.87 - 5.11 MIL/uL   Hemoglobin 10.6 (L) 12.0 - 15.0 g/dL   HCT 04/17/19 (L) 16.1 - 09.6 %   MCV 85.9 80.0 - 100.0 fL   MCH 27.7 26.0 - 34.0 pg   MCHC 32.2 30.0 - 36.0 g/dL   RDW 04.5 40.9 - 81.1 %   Platelets 274 150 - 400 K/uL   nRBC 0.0 0.0 - 0.2 %  Comprehensive metabolic panel     Status: Abnormal   Collection Time: 04/15/19 10:48 PM  Result Value Ref Range   Sodium 138 135 - 145  mmol/L   Potassium 3.5 3.5 - 5.1 mmol/L   Chloride 108 98 - 111 mmol/L   CO2 22 22 - 32 mmol/L   Glucose, Bld 81 70 - 99 mg/dL   BUN 7 6 - 20 mg/dL   Creatinine, Ser 0.67 0.44 - 1.00 mg/dL   Calcium 9.1 8.9 - 10.3 mg/dL   Total Protein 6.4 (L) 6.5 - 8.1 g/dL   Albumin 3.0 (L) 3.5 - 5.0 g/dL   AST 20 15 - 41 U/L   ALT 13 0 - 44 U/L   Alkaline Phosphatase 280 (H) 38 - 126 U/L   Total Bilirubin 0.8 0.3 - 1.2 mg/dL   GFR calc non Af Amer >60 >60 mL/min   GFR calc Af Amer >60 >60 mL/min   Anion gap 8 5 - 15   A:  1. Labor, false (Braxton-Hicks), antepartum   2. NST (non-stress test) reactive   3. [redacted] weeks gestation of pregnancy   4. Elevated BP without diagnosis of hypertension    P:  Discharge home Follow up as scheduled in the office for prenatal care and BP check today  Return to MAU as needed for reasons discussed and/or emergencies  PEC precautions    Lajean Manes, CNM 04/16/2019 12:51 AM

## 2019-04-16 DIAGNOSIS — O471 False labor at or after 37 completed weeks of gestation: Secondary | ICD-10-CM

## 2019-04-16 DIAGNOSIS — Z3A37 37 weeks gestation of pregnancy: Secondary | ICD-10-CM

## 2019-04-16 LAB — COMPREHENSIVE METABOLIC PANEL
ALT: 13 U/L (ref 0–44)
AST: 20 U/L (ref 15–41)
Albumin: 3 g/dL — ABNORMAL LOW (ref 3.5–5.0)
Alkaline Phosphatase: 280 U/L — ABNORMAL HIGH (ref 38–126)
Anion gap: 8 (ref 5–15)
BUN: 7 mg/dL (ref 6–20)
CO2: 22 mmol/L (ref 22–32)
Calcium: 9.1 mg/dL (ref 8.9–10.3)
Chloride: 108 mmol/L (ref 98–111)
Creatinine, Ser: 0.67 mg/dL (ref 0.44–1.00)
GFR calc Af Amer: >60 mL/min (ref >60)
GFR calc non Af Amer: >60 mL/min (ref >60)
Glucose, Bld: 81 mg/dL (ref 70–99)
Potassium: 3.5 mmol/L (ref 3.5–5.1)
Sodium: 138 mmol/L (ref 135–145)
Total Bilirubin: 0.8 mg/dL (ref 0.3–1.2)
Total Protein: 6.4 g/dL — ABNORMAL LOW (ref 6.5–8.1)

## 2019-04-16 LAB — PROTEIN / CREATININE RATIO, URINE
Creatinine, Urine: 105.67 mg/dL
Protein Creatinine Ratio: 0.14 mg/mg{Cre} (ref 0.00–0.15)
Total Protein, Urine: 15 mg/dL

## 2019-04-17 ENCOUNTER — Other Ambulatory Visit: Payer: Self-pay

## 2019-04-17 ENCOUNTER — Inpatient Hospital Stay (HOSPITAL_COMMUNITY)
Admission: AD | Admit: 2019-04-17 | Discharge: 2019-04-17 | Disposition: A | Discharge status: Home / Self Care | Payer: Managed Care, Other (non HMO) | Attending: Obstetrics and Gynecology | Admitting: Obstetrics and Gynecology

## 2019-04-17 ENCOUNTER — Encounter (HOSPITAL_COMMUNITY): Payer: Self-pay | Admitting: Obstetrics and Gynecology

## 2019-04-17 ENCOUNTER — Ambulatory Visit: Payer: Medicaid Other | Admitting: Cardiovascular Disease

## 2019-04-17 DIAGNOSIS — O471 False labor at or after 37 completed weeks of gestation: Secondary | ICD-10-CM | POA: Diagnosis present

## 2019-04-17 DIAGNOSIS — Z3A38 38 weeks gestation of pregnancy: Secondary | ICD-10-CM | POA: Diagnosis not present

## 2019-04-17 LAB — AMNISURE RUPTURE OF MEMBRANE (ROM) NOT AT ARMC: Amnisure ROM: NEGATIVE

## 2019-04-17 NOTE — MAU Provider Note (Signed)
History     CSN: [id number]  Arrival date and time: 04/17/19 1829   Provider's first contact with patient at 2018    Chief Complaint  Patient presents with  . Contractions  . Rupture of Membranes   HPI  Ms.  Theresa Marshall is a 23 y.o. year old G1P0 female at [redacted]w[redacted]d weeks gestation who presents to MAU reporting contractions with leaking of fluid since 1650 this evening. She reports that she has been 1 cm for weeks now and having constant back pain. She is tearful stating that she is "in pain and tired of being here." She is requesting being discharged and not being observed for an additional hour.  Past Medical History:  Diagnosis Date  . Anemia   . Anxiety   . Infection    UTI, yeast  . Ovarian cyst   . Pregnancy   . Seizures (HCC)   . Vaginosis     Past Surgical History:  Procedure Laterality Date  . APPENDECTOMY  2008    Family History  Problem Relation Age of Onset  . Healthy Mother   . Healthy Father     Social History   Tobacco Use  . Smoking status: Never Smoker  . Smokeless tobacco: Never Used  Substance Use Topics  . Alcohol use: Never  . Drug use: Not Currently    Types: Marijuana    Comment:  last use 09/05/18    Allergies:  Allergies  Allergen Reactions  . Metronidazole Other (See Comments)    Rash, had seizure after taking- saw neurologist, felt not related,  Seizures continued after stopping medication  . Pecan Nut (Diagnostic) Itching and Swelling    Patient is also allergic to walnuts.    Medications Prior to Admission  Medication Sig Dispense Refill Last Dose  . butalbital-acetaminophen-caffeine (FIORICET) 50-325-40 MG tablet Take 1 tablet as needed for severe headache. Do not take more than 2-3 a week 10 tablet 5 Past Week at Unknown time  . Elastic Bandages & Supports (COMFORT FIT MATERNITY SUPP MED) MISC 1 Units by Does not apply route daily. 1 each 0 04/17/2019 at Unknown time  . iron polysaccharides (NIFEREX) 150 MG capsule Take  1 capsule (150 mg total) by mouth daily. 60 capsule 1 04/17/2019 at Unknown time  . Magnesium 400 MG CAPS Take 1 capsule daily 30 capsule 11 04/17/2019 at Unknown time  . Prenatal Vit-Fe Fumarate-FA (MULTIVITAMIN-PRENATAL) 27-0.8 MG TABS tablet Take 1 tablet by mouth daily at 12 noon.   04/17/2019 at Unknown time  . VENTOLIN HFA 108 (90 Base) MCG/ACT inhaler Inhale 2 puffs into the lungs every 6 (six) hours as needed for wheezing. 6.7 g 1 Past Month at Unknown time    Review of Systems  Constitutional: Negative.   HENT: Negative.   Eyes: Negative.   Respiratory: Negative.   Cardiovascular: Negative.   Gastrointestinal: Negative.   Endocrine: Negative.   Genitourinary: Positive for pelvic pain (contractions) and vaginal discharge (leaking fluid since 1650).  Musculoskeletal: Positive for back pain.  Skin: Negative.   Allergic/Immunologic: Negative.   Neurological: Negative.   Hematological: Negative.   Psychiatric/Behavioral: Negative.    Physical Exam   Blood pressure 129/82, pulse (!) 103, temperature 97.8 F (36.6 C), resp. rate 20, height 5\' 6"  (1.676 m), weight 75.8 kg, last menstrual period 07/12/2018, SpO2 97 %.  Physical Exam  Nursing note and vitals reviewed. Constitutional: She is oriented to person, place, and time. She appears well-developed and well-nourished.  HENT:  Head: Normocephalic and atraumatic.  Eyes: Pupils are equal, round, and reactive to light.  Cardiovascular: Normal rate.  Respiratory: Effort normal.  GI: Soft.  Genitourinary:    Genitourinary Comments: Cervical Amnisure exam by RN   Musculoskeletal:        General: Normal range of motion.     Cervical back: Normal range of motion.  Neurological: She is alert and oriented to person, place, and time.  Skin: Skin is warm and dry.  Psychiatric: She has a normal mood and affect. Her behavior is normal. Judgment and thought content normal.   NST - FHR: 140 bpm / moderate variability / accels present  / decels absent / TOCO: regular every 4 mins   Dilation: 1 Effacement (%): 50 Cervical Position: Posterior Station: -2 Presentation: Vertex Exam by:: [email or web address],rnc --> unchanged after 1 hour observation  MAU Course  Procedures  MDM Fern Slide -- Negative Amnisure  Results for orders placed or performed during the hospital encounter of 04/17/19 (from the past 24 hour(s))  Amnisure     Status: None   Collection Time: 04/17/19  7:16 PM  Result Value Ref Range   Amnisure ROM NEGATIVE     Assessment and Plan  False labor after 37 completed weeks of gestation - Plan: Discharge patient - Discussed comfort measures for prodromal labor: get something to eat, take a warm bath, take 2 Tylenol PM and rest - Discussed labor precautions. Information provided on braxton-hicks ctxs  - Keep scheduled appt with [facility] - Patient verbalized an understanding of the plan of care and agrees.    Laury Deep, MSN, CNM 04/17/2019, 8:17 PM

## 2019-04-17 NOTE — MAU Note (Signed)
.   Theresa Marshall is a 23 y.o. at [redacted]w[redacted]d here in MAU reporting: contractions with LOF since 1650 this evenin Onset of complaint: 1650 Pain score: 7 Vitals:   04/17/19 1849  BP: 126/78  Pulse: 91  Resp: 18  Temp: 97.8 F (36.6 C)  SpO2: 100%     FHT:145 Lab orders placed from triage:

## 2019-04-17 NOTE — Progress Notes (Signed)
Pt does not want to stay for further fetal monitoring. Monitors d/c'd by CNM.

## 2019-04-17 NOTE — Discharge Instructions (Signed)
Eat a sandwich or some form of protein, soak in a tub of warm water for 20 minutes, and then take 2 Tylenol PM tablets.

## 2019-04-19 NOTE — L&D Delivery Note (Signed)
Delivery Note Theresa Marshall is a G1P0 at [redacted]w[redacted]d who had a spontaneous delivery at 8:01am on 04/26/19 a viable female was delivered via  LOA.  APGAR: 8,9 ; weight 3530g (7lb12.5oz)   Admitted for SROM. Induced with pitocin. Received epidural for pain management. Progressed normally. Pushed for 1 hour 31 minutes. Baby was delivered without difficulty. No nuchal cord. Delayed cord clamping for 60 seconds. Delivery of placenta was spontaneous. Placenta was found to be intact, 3 -vessel cord was noted. The fundus was found to be firm. 1st degree perineal laceration was repaired in a figure of eight with 3-0 vicryl. Bilateral periurethral lacerations were not repaired. Estimated blood loss 117cc. Instrument and gauze counts were correct at the end of the procedure. Planceta to pathology given chorioamnionitis diagnosed on intrapartum. Mom to postpartum.  Baby to Couplet care / Skin to Skin.  Edword Cu K Taam-Akelman 04/26/2019, 8:16 AM

## 2019-04-21 ENCOUNTER — Encounter (HOSPITAL_COMMUNITY): Payer: Self-pay | Admitting: Obstetrics and Gynecology

## 2019-04-21 ENCOUNTER — Other Ambulatory Visit: Payer: Self-pay

## 2019-04-21 ENCOUNTER — Inpatient Hospital Stay (EMERGENCY_DEPARTMENT_HOSPITAL)
Admission: AD | Admit: 2019-04-21 | Discharge: 2019-04-22 | Disposition: A | Discharge status: Home / Self Care | Payer: Managed Care, Other (non HMO) | Source: Home / Self Care | Attending: Obstetrics and Gynecology | Admitting: Obstetrics and Gynecology

## 2019-04-21 DIAGNOSIS — O471 False labor at or after 37 completed weeks of gestation: Secondary | ICD-10-CM

## 2019-04-21 DIAGNOSIS — O99891 Other specified diseases and conditions complicating pregnancy: Secondary | ICD-10-CM | POA: Insufficient documentation

## 2019-04-21 DIAGNOSIS — R42 Dizziness and giddiness: Secondary | ICD-10-CM | POA: Insufficient documentation

## 2019-04-21 DIAGNOSIS — Z3A38 38 weeks gestation of pregnancy: Secondary | ICD-10-CM | POA: Insufficient documentation

## 2019-04-21 DIAGNOSIS — Z881 Allergy status to other antibiotic agents status: Secondary | ICD-10-CM | POA: Insufficient documentation

## 2019-04-21 DIAGNOSIS — Z91018 Allergy to other foods: Secondary | ICD-10-CM | POA: Insufficient documentation

## 2019-04-21 NOTE — MAU Note (Addendum)
Pt reports she got dizzy and lightheaded while in the shower. Felt like she was "about to have a seizure and after that everything was a blur. Feels like body is tired how it normally is after she has a seizure." States boyfriend witnessed it. Does not know how long she seized for. Wants to make sure everything is okay with baby. Not on medication for seizures since 2015. Saw neurologist in October and was told she doesn't need medication. OB wants patient to have MRI but unable to go. Reports good fetal movement. Contractions irregularly every 10-6mins. No bleeding.

## 2019-04-22 DIAGNOSIS — Z3A38 38 weeks gestation of pregnancy: Secondary | ICD-10-CM | POA: Diagnosis not present

## 2019-04-22 DIAGNOSIS — R42 Dizziness and giddiness: Secondary | ICD-10-CM | POA: Diagnosis not present

## 2019-04-22 DIAGNOSIS — O26893 Other specified pregnancy related conditions, third trimester: Secondary | ICD-10-CM | POA: Diagnosis not present

## 2019-04-22 LAB — URINALYSIS, ROUTINE W REFLEX MICROSCOPIC
Bilirubin Urine: NEGATIVE
Glucose, UA: NEGATIVE mg/dL
Hgb urine dipstick: NEGATIVE
Ketones, ur: NEGATIVE mg/dL
Nitrite: NEGATIVE
Protein, ur: NEGATIVE mg/dL
Specific Gravity, Urine: 1.01 (ref 1.005–1.030)
pH: 6 (ref 5.0–8.0)

## 2019-04-22 NOTE — MAU Provider Note (Signed)
History     CSN: [id number]  Arrival date and time: 04/21/19 2313   First Provider Initiated Contact with Patient 04/22/19 0011      Chief Complaint  Patient presents with  . Seizures   HPI  Theresa Marshall is a 24 yo G1P0 at 52.6 EGA who is presenting to MAU after an episode of dizziness with questionable seizure-like activity. She receives her prenatal care at [facility].  Patient reports that she was taking a hot shower and started to feel dizzy/lightheaded. She called out for her FOB, and ended up on the floor. FOB is at bedside and he reports that she was on the floor of the bathroom when he reached her. He did not witnessed any jerking, convulsing, tongue-biting, or drooling. The patient reports that she did not urinate or defecate during the episode. FOB reports that after 3-5 minutes the patient was responsive and talking coherently to him. He got her onto the bed and she had another episode where she "went still and looked dazed." No witnessing of tonic-clonic movements, tongue biting, or drooling. These events happened just before 9:30 PM.  Patient does have a history of seizures, she follows with [place] neurology. She reports that her last one was 4 years ago. Her seizures were never tonic-clonic, but she does endorse a history of pro-dromal heaviness and dizziness with her seizures consisting of a vacant stare. 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 She does have a neurology appointment on 04/30/19.  Otherwise, right now the patient feels fine. She has been more anxious in the past week because she is nearing her due date. Her FOB is supportive, and is with her constantly to make sure she is okay.  OB History    Gravida  1   Para      Term      Preterm      AB      Living        SAB      TAB      Ectopic      Multiple      Live Births              Past Medical History:  Diagnosis Date  . Anemia   . Anxiety   . Infection    UTI, yeast  . Ovarian cyst   . Pregnancy   . Seizures (HCC)   . Vaginosis     Past Surgical History:  Procedure Laterality Date  . APPENDECTOMY  2008    Family History  Problem Relation Age of Onset  . Healthy Mother   . Healthy Father     Social History   Tobacco Use  . Smoking status: Never Smoker  . Smokeless tobacco: Never Used  Substance Use Topics  . Alcohol use: Never  . Drug use: Not Currently    Types: Marijuana    Comment:  last use 09/05/18    Allergies:  Allergies  Allergen Reactions  . Metronidazole Other (See Comments)    Rash, had seizure after taking- saw neurologist, felt not related,  Seizures continued after stopping medication  . Pecan Nut (Diagnostic) Itching and Swelling    Patient is also allergic to walnuts.    Medications Prior to Admission  Medication Sig Dispense Refill Last Dose  . butalbital-acetaminophen-caffeine (FIORICET)  50-325-40 MG tablet Take 1 tablet as needed for severe headache. Do not take more than 2-3 a week 10 tablet 5 Past Week at Unknown time  . Elastic Bandages & Supports (COMFORT FIT MATERNITY SUPP MED) MISC 1 Units by Does not apply route daily. 1 each 0 Past Week at Unknown time  . iron polysaccharides (NIFEREX) 150 MG capsule Take 1 capsule (150 mg total) by mouth daily. 60 capsule 1 04/21/2019 at Unknown time  . Magnesium 400 MG CAPS Take 1 capsule daily 30 capsule 11 04/21/2019 at Unknown time  . Prenatal Vit-Fe Fumarate-FA (MULTIVITAMIN-PRENATAL) 27-0.8 MG TABS tablet Take 1 tablet by mouth daily at 12 noon.   04/21/2019 at Unknown time  . VENTOLIN HFA 108 (90 Base) MCG/ACT inhaler Inhale 2 puffs into the lungs every 6 (six) hours as needed for wheezing. 6.7 g 1 Past Week at Unknown time    Review of Systems Physical Exam   Blood pressure 131/75, pulse (!) 101,  temperature 98.2 F (36.8 C), temperature source Oral, resp. rate 18, height 5\' 6"  (1.676 m), weight 78.5 kg, last menstrual period 07/12/2018, SpO2 100 %.  Physical Exam  Nursing note and vitals reviewed. Constitutional: She is oriented to person, place, and time. She appears well-developed and well-nourished.  HENT:  Head: Normocephalic and atraumatic.  Eyes: Pupils are equal, round, and reactive to light. Conjunctivae and EOM are normal.  Cardiovascular: Normal rate, regular rhythm, normal heart sounds and intact distal pulses.  Respiratory: Effort normal and breath sounds normal.  GI: Soft. Bowel sounds are normal.  Gravid  Musculoskeletal:        General: No tenderness, deformity or edema. Normal range of motion.     Cervical back: Normal range of motion and neck supple.  Neurological: She is alert and oriented to person, place, and time. She has normal reflexes. She displays normal reflexes. No cranial nerve deficit. She exhibits normal muscle tone. Coordination normal.  Skin: Skin is warm and dry. No rash noted. No erythema.  Psychiatric: She has a normal mood and affect. Her behavior is normal. Judgment and thought content normal.    MAU Course  Procedures  MDM -Neuro exam normal -No signs of tongue-biting or trauma -Cat I FHR tracing -no Signs/Symptoms of Pre-Eclampsia  NST -baseline: 140 -variability: moderate -accels: 15x15 -decels: absent -interpretation: reactive   Assessment and Plan  24 yo G1P0 at 84.6 EGA who presented to MAU after an unwitnessed per-syncopal event -Case discussed with Dr. Rosana Hoes -Patient coherent and without signs of trauma, fetal status reassuring -OK to DC home, strict return precautions given -Patient advised to call [facility] OB tomorrow to see if they want to schedule her for induction -Patient to follow up with her neurologist on 04/30/19  Justin Meisenheimer L Thatcher Doberstein 04/22/2019, 12:28 AM

## 2019-04-22 NOTE — Discharge Instructions (Signed)
Dizziness Dizziness is a common problem. It is a feeling of unsteadiness or light-headedness. You may feel like you are about to faint. Dizziness can lead to injury if you stumble or fall. Anyone can become dizzy, but dizziness is more common in older adults. This condition can be caused by a number of things, including medicines, dehydration, or illness. Follow these instructions at home: Eating and drinking  Drink enough fluid to keep your urine clear or pale yellow. This helps to keep you from becoming dehydrated. Try to drink more clear fluids, such as water.  Do not drink alcohol.  Limit your caffeine intake if told to do so by your health care provider. Check ingredients and nutrition facts to see if a food or beverage contains caffeine.  Limit your salt (sodium) intake if told to do so by your health care provider. Check ingredients and nutrition facts to see if a food or beverage contains sodium. Activity  Avoid making quick movements. ? Rise slowly from chairs and steady yourself until you feel okay. ? In the morning, first sit up on the side of the bed. When you feel okay, stand slowly while you hold onto something until you know that your balance is fine.  If you need to stand in one place for a long time, move your legs often. Tighten and relax the muscles in your legs while you are standing.  Do not drive or use heavy machinery if you feel dizzy.  Avoid bending down if you feel dizzy. Place items in your home so that they are easy for you to reach without leaning over. Lifestyle  Do not use any products that contain nicotine or tobacco, such as cigarettes and e-cigarettes. If you need help quitting, ask your health care provider.  Try to reduce your stress level by using methods such as yoga or meditation. Talk with your health care provider if you need help to manage your stress. General instructions  Watch your dizziness for any changes.  Take over-the-counter and  prescription medicines only as told by your health care provider. Talk with your health care provider if you think that your dizziness is caused by a medicine that you are taking.  Tell a friend or a family member that you are feeling dizzy. If he or she notices any changes in your behavior, have this person call your health care provider.  Keep all follow-up visits as told by your health care provider. This is important. Contact a health care provider if:  Your dizziness does not go away.  Your dizziness or light-headedness gets worse.  You feel nauseous.  You have reduced hearing.  You have new symptoms.  You are unsteady on your feet or you feel like the room is spinning. Get help right away if:  You vomit or have diarrhea and are unable to eat or drink anything.  You have problems talking, walking, swallowing, or using your arms, hands, or legs.  You feel generally weak.  You are not thinking clearly or you have trouble forming sentences. It may take a friend or family member to notice this.  You have chest pain, abdominal pain, shortness of breath, or sweating.  Your vision changes.  You have any bleeding.  You have a severe headache.  You have neck pain or a stiff neck.  You have a fever. These symptoms may represent a serious problem that is an emergency. Do not wait to see if the symptoms will go away. Get medical help   right away. Call your local emergency services (911 in the [place] Do not drive yourself to the hospital. Summary  Dizziness is a feeling of unsteadiness or light-headedness. This condition can be caused by a number of things, including medicines, dehydration, or illness.  Anyone can become dizzy, but dizziness is more common in older adults.  Drink enough fluid to keep your urine clear or pale yellow. Do not drink alcohol.  Avoid making quick movements if you feel dizzy. Monitor your dizziness for any changes. This information is not intended to  replace advice given to you by your health care provider. Make sure you discuss any questions you have with your health care provider. Document Revised: 04/07/2017 Document Reviewed: 05/07/2016 Elsevier Patient Education  2020 Elsevier Inc.  

## 2019-04-24 ENCOUNTER — Encounter: Payer: Self-pay | Admitting: General Practice

## 2019-04-25 ENCOUNTER — Inpatient Hospital Stay (HOSPITAL_COMMUNITY)
Admission: AD | Admit: 2019-04-25 | Discharge: 2019-04-28 | DRG: 806 | Disposition: A | Discharge status: Home / Self Care | Payer: Managed Care, Other (non HMO) | Attending: Obstetrics | Admitting: Obstetrics

## 2019-04-25 ENCOUNTER — Inpatient Hospital Stay (HOSPITAL_COMMUNITY): Payer: Managed Care, Other (non HMO) | Admitting: Anesthesiology

## 2019-04-25 ENCOUNTER — Encounter (HOSPITAL_COMMUNITY): Payer: Self-pay | Admitting: Obstetrics

## 2019-04-25 ENCOUNTER — Other Ambulatory Visit: Payer: Self-pay

## 2019-04-25 DIAGNOSIS — O9832 Other infections with a predominantly sexual mode of transmission complicating childbirth: Secondary | ICD-10-CM | POA: Diagnosis present

## 2019-04-25 DIAGNOSIS — O9952 Diseases of the respiratory system complicating childbirth: Secondary | ICD-10-CM | POA: Diagnosis present

## 2019-04-25 DIAGNOSIS — O471 False labor at or after 37 completed weeks of gestation: Secondary | ICD-10-CM

## 2019-04-25 DIAGNOSIS — A5901 Trichomonal vulvovaginitis: Secondary | ICD-10-CM | POA: Diagnosis present

## 2019-04-25 DIAGNOSIS — J45909 Unspecified asthma, uncomplicated: Secondary | ICD-10-CM | POA: Diagnosis present

## 2019-04-25 DIAGNOSIS — O411230 Chorioamnionitis, third trimester, not applicable or unspecified: Principal | ICD-10-CM | POA: Diagnosis present

## 2019-04-25 DIAGNOSIS — Z3A39 39 weeks gestation of pregnancy: Secondary | ICD-10-CM | POA: Diagnosis not present

## 2019-04-25 DIAGNOSIS — Z883 Allergy status to other anti-infective agents status: Secondary | ICD-10-CM

## 2019-04-25 DIAGNOSIS — R42 Dizziness and giddiness: Secondary | ICD-10-CM | POA: Diagnosis present

## 2019-04-25 DIAGNOSIS — Z91018 Allergy to other foods: Secondary | ICD-10-CM | POA: Diagnosis not present

## 2019-04-25 DIAGNOSIS — Z20822 Contact with and (suspected) exposure to covid-19: Secondary | ICD-10-CM | POA: Diagnosis present

## 2019-04-25 DIAGNOSIS — O99891 Other specified diseases and conditions complicating pregnancy: Secondary | ICD-10-CM | POA: Diagnosis present

## 2019-04-25 DIAGNOSIS — O26893 Other specified pregnancy related conditions, third trimester: Secondary | ICD-10-CM | POA: Diagnosis present

## 2019-04-25 DIAGNOSIS — Z881 Allergy status to other antibiotic agents status: Secondary | ICD-10-CM | POA: Diagnosis not present

## 2019-04-25 DIAGNOSIS — O4290 Premature rupture of membranes, unspecified as to length of time between rupture and onset of labor, unspecified weeks of gestation: Secondary | ICD-10-CM | POA: Diagnosis present

## 2019-04-25 HISTORY — DX: Unspecified asthma, uncomplicated: J45.909

## 2019-04-25 LAB — CBC
HCT: 33.7 % — ABNORMAL LOW (ref 36.0–46.0)
Hemoglobin: 10.5 g/dL — ABNORMAL LOW (ref 12.0–15.0)
MCH: 26.7 pg (ref 26.0–34.0)
MCHC: 31.2 g/dL (ref 30.0–36.0)
MCV: 85.8 fL (ref 80.0–100.0)
Platelets: 282 10*3/uL (ref 150–400)
RBC: 3.93 MIL/uL (ref 3.87–5.11)
RDW: 15 % (ref 11.5–15.5)
WBC: 8.2 10*3/uL (ref 4.0–10.5)
nRBC: 0 % (ref 0.0–0.2)

## 2019-04-25 LAB — RESPIRATORY PANEL BY RT PCR (FLU A&B, COVID)
Influenza A by PCR: NEGATIVE
Influenza B by PCR: NEGATIVE
SARS Coronavirus 2 by RT PCR: NEGATIVE

## 2019-04-25 LAB — TYPE AND SCREEN
ABO/RH(D): O POS
Antibody Screen: NEGATIVE

## 2019-04-25 LAB — ABO/RH: ABO/RH(D): O POS

## 2019-04-25 MED ORDER — LACTATED RINGERS IV SOLN: INTRAVENOUS | Status: DC

## 2019-04-25 MED ORDER — LACTATED RINGERS IV SOLN
500.0000 mL | INTRAVENOUS | Status: DC | PRN
  Administered 2019-04-25: 1000 mL via INTRAVENOUS

## 2019-04-25 MED ORDER — EPHEDRINE 5 MG/ML INJ: 10.0000 mg | INTRAVENOUS | Status: DC | PRN

## 2019-04-25 MED ORDER — LACTATED RINGERS IV SOLN
500.0000 mL | Freq: Once | INTRAVENOUS | Status: AC
  Administered 2019-04-25: 500 mL via INTRAVENOUS

## 2019-04-25 MED ORDER — PHENYLEPHRINE 40 MCG/ML (10ML) SYRINGE FOR IV PUSH (FOR BLOOD PRESSURE SUPPORT): 80.0000 ug | PREFILLED_SYRINGE | INTRAVENOUS | Status: DC | PRN

## 2019-04-25 MED ORDER — OXYCODONE-ACETAMINOPHEN 5-325 MG PO TABS: 2.0000 | ORAL_TABLET | ORAL | Status: DC | PRN

## 2019-04-25 MED ORDER — SOD CITRATE-CITRIC ACID 500-334 MG/5ML PO SOLN: 30.0000 mL | ORAL | Status: DC | PRN

## 2019-04-25 MED ORDER — LIDOCAINE HCL (PF) 1 % IJ SOLN
INTRAMUSCULAR | Status: DC | PRN
  Administered 2019-04-25: 5 mL via EPIDURAL

## 2019-04-25 MED ORDER — TERBUTALINE SULFATE 1 MG/ML IJ SOLN: 0.2500 mg | Freq: Once | INTRAMUSCULAR | Status: DC | PRN

## 2019-04-25 MED ORDER — OXYTOCIN BOLUS FROM INFUSION
500.0000 mL | Freq: Once | INTRAVENOUS | Status: AC
  Administered 2019-04-26: 500 mL via INTRAVENOUS

## 2019-04-25 MED ORDER — ACETAMINOPHEN 325 MG PO TABS: 650.0000 mg | ORAL_TABLET | ORAL | Status: DC | PRN

## 2019-04-25 MED ORDER — OXYTOCIN 40 UNITS IN NORMAL SALINE INFUSION - SIMPLE MED
1.0000 m[IU]/min | INTRAVENOUS | Status: DC
  Administered 2019-04-25: 2 m[IU]/min via INTRAVENOUS
  Administered 2019-04-25: 10 m[IU]/min via INTRAVENOUS

## 2019-04-25 MED ORDER — FENTANYL-BUPIVACAINE-NACL 0.5-0.125-0.9 MG/250ML-% EP SOLN
12.0000 mL/h | EPIDURAL | Status: DC | PRN
  Filled 2019-04-25 (×2): qty 250

## 2019-04-25 MED ORDER — SODIUM CHLORIDE (PF) 0.9 % IJ SOLN
INTRAMUSCULAR | Status: DC | PRN
  Administered 2019-04-25: 12 mL/h via EPIDURAL

## 2019-04-25 MED ORDER — FENTANYL CITRATE (PF) 100 MCG/2ML IJ SOLN: 50.0000 ug | INTRAMUSCULAR | Status: DC | PRN

## 2019-04-25 MED ORDER — OXYCODONE-ACETAMINOPHEN 5-325 MG PO TABS: 1.0000 | ORAL_TABLET | ORAL | Status: DC | PRN

## 2019-04-25 MED ORDER — ONDANSETRON HCL 4 MG/2ML IJ SOLN: 4.0000 mg | Freq: Four times a day (QID) | INTRAMUSCULAR | Status: DC | PRN

## 2019-04-25 MED ORDER — DIPHENHYDRAMINE HCL 50 MG/ML IJ SOLN
12.5000 mg | INTRAMUSCULAR | Status: DC | PRN
  Administered 2019-04-25 (×2): 12.5 mg via INTRAVENOUS
  Filled 2019-04-25: qty 1

## 2019-04-25 MED ORDER — LIDOCAINE HCL (PF) 1 % IJ SOLN: 30.0000 mL | INTRAMUSCULAR | Status: DC | PRN

## 2019-04-25 MED ORDER — OXYTOCIN 40 UNITS IN NORMAL SALINE INFUSION - SIMPLE MED
2.5000 [IU]/h | INTRAVENOUS | Status: DC
  Filled 2019-04-25: qty 1000

## 2019-04-25 NOTE — H&P (Signed)
24 y.o. G1P0 @ [redacted]w[redacted]d presents with contractions.  While in MAU, had spontaneous rupture of membranes.  Ruled in with positive fern, positive pooling.  Otherwise has good fetal movement and no bleeding.  Pregnancy c/b:  1. History of seizure disorder.  No meds since 2016.  Has not followed up with neurology this pregnancy.  Reports last episode of seizure like activity on 04/21/2019, unwitnessed, occurred while taking a shower.  Felt hot / dizzy.  Called for FOB--he found her on the floor without evidence of seizure like activity or change in LOC.  2.  Trichomonas: diagnosed last week.  Treatment held due to reported allergy to metronidazole 3. Asthma: exercise induced.  Inhaler use 1x/week  Past Medical History:  Diagnosis Date  . Anemia   . Anxiety   . Asthma    exercise induced  . Infection    UTI, yeast  . Ovarian cyst   . Pregnancy   . Seizures (HCC)    last seizure 04/21/2019  . Vaginosis     Past Surgical History:  Procedure Laterality Date  . APPENDECTOMY  2008    OB History  Gravida Para Term Preterm AB Living  1            SAB TAB Ectopic Multiple Live Births               # Outcome Date GA Lbr Len/2nd Weight Sex Delivery Anes PTL Lv  1 Current             Social History   Socioeconomic History  . Marital status: Single    Spouse name: Not on file  . Number of children: 0  . Years of education: college  . Highest education level: Not on file  Occupational History  . Not on file  Tobacco Use  . Smoking status: Never Smoker  . Smokeless tobacco: Never Used  Substance and Sexual Activity  . Alcohol use: Never  . Drug use: Not Currently    Types: Marijuana    Comment:  last use 09/05/18  . Sexual activity: Yes    Partners: Male    Birth control/protection: None  Other Topics Concern  . Not on file  Social History Narrative   Patient is single, does not have any children   Patient is right handed   Education currently a freshman in college   Caffeine  consumption is 1 a week   Currently [redacted] weeks pregnant     Metronidazole and Pecan nut (diagnostic)    Prenatal Transfer Tool  Maternal Diabetes: No Genetic Screening: Normal Maternal Ultrasounds/Referrals: Normal Fetal Ultrasounds or other Referrals:  None Maternal Substance Abuse:  Yes:  Type: Marijuana, quit with pregnancy Significant Maternal Medications:  None Significant Maternal Lab Results: Group B Strep negative  ABO, Rh: --/--/O POS (01/07 1258) Antibody: PENDING (01/07 1258) Rubella: Immune (06/22 0000) RPR: Nonreactive (06/22 0000)  HBsAg: Negative (06/22 0000)  HIV: Non-reactive (06/22 0000)  GBS: Negative/-- (12/23 0000)     Vitals:   04/25/19 1339 04/25/19 1415  BP:  125/70  Pulse:  88  Resp:  16  Temp: 98.4 F (36.9 C)   SpO2:       General:  NAD Abdomen:  soft, gravid Ex:  trace edema SVE:  1/70/-2 per RN FHTs:  140s, moderate variability, + accels, category q Toco:  Uterine irritability with irregular contractions   A/P   24 y.o. G1P0 [redacted]w[redacted]d presents with ROM Admit to L&D.  Pitocin for IOL  given consistent uterine irritability on monitor.  Trichomonas--diagnosed last week.  Treatment held due to drug allergy, however on further questioning, patient reports "vaginal rash" and itching for 45-60 minutes after dose and no systemic symptoms. May be cross reactivity with tinidazole, but will plan treatment after delivery in event reaction occurs  FSR/ vtx/ GBS neg  [place]

## 2019-04-25 NOTE — MAU Note (Signed)
Presents with c/o ctxs that began last night, but have gotten more regular this morning.  Denies VB or LOF.  Reports +FM.

## 2019-04-25 NOTE — Progress Notes (Signed)
Pt seen and examined.  Comfortable with epidural  BP 124/77   Pulse 85   Temp 98 F (36.7 C) (Oral)   Resp 16   Ht 5\' 6"  (1.676 m)   Wt 78.5 kg   LMP 07/12/2018   SpO2 100%   BMI 27.92 kg/m   Toco: q3-5 minutes on 6 mU pitocin EFM: 140s, moderate variability, category 1 SVE: 3/90/-1 per RN  A/P G1 @ [redacted]w[redacted]d w labor Continue pitocin Anticipate SVD

## 2019-04-25 NOTE — MAU Provider Note (Signed)
None      S: [name] is a 24 y.o. G1P0 at [redacted]w[redacted]d  who presents to MAU today complaining of leaking of fluid since 11:30 am. She denies vaginal bleeding. She endorses contractions. She reports normal fetal movement.    O: BP 139/77 (BP Location: Right Arm)   Pulse 88   Temp 97.9 F (36.6 C) (Oral)   LMP 07/12/2018   SpO2 100%  GENERAL: Well-developed, well-nourished female in no acute distress.  HEAD: Normocephalic, atraumatic.  CHEST: Normal effort of breathing, regular heart rate ABDOMEN: Soft, nontender, gravid PELVIC: Normal external female genitalia. Vagina is pink and rugated. Cervix with normal contour, no lesions. Normal discharge.  Positive pooling.   Ferning slide positive for confirmation  Cervical exam:  Dilation: 1 Effacement (%): 70 Station: -2 Presentation: Vertex Exam by:: L. Leftwich-Kirby, CNM  Limited OB US performed confirming vertex presentation.   Pt informed that the ultrasound is considered a limited OB ultrasound and is not intended to be a complete ultrasound exam.  Patient also informed that the ultrasound is not being completed with the intent of assessing for fetal or placental anomalies or any pelvic abnormalities.  Explained that the purpose of today's ultrasound is to assess for  presentation.  Patient acknowledges the purpose of the exam and the limitations of the study.      Fetal Monitoring: Baseline: 135 Variability: moderate Accelerations: present Decelerations: occasional variables, lasting 15 sec Contractions: Q 3-6 minutes  No results found for this or any previous visit (from the past 24 hour(s)).  A: [facility] at [redacted]w[redacted]d  SROM  P: RN to call Dr Chestine Spore for admission  Kendell Bane 04/25/2019 12:27 PM

## 2019-04-25 NOTE — Anesthesia Preprocedure Evaluation (Signed)
Anesthesia Evaluation  Patient identified by MRN, date of birth, ID band Patient awake    Reviewed: Allergy & Precautions, NPO status , Patient's Chart, lab work & pertinent test results  Airway Mallampati: II  TM Distance: >3 FB Neck ROM: Full    Dental no notable dental hx. (+) Teeth Intact   Pulmonary asthma ,    Pulmonary exam normal breath sounds clear to auscultation       Cardiovascular Exercise Tolerance: Good negative cardio ROS Normal cardiovascular exam Rhythm:Regular Rate:Normal     Neuro/Psych Seizures -,  negative psych ROS   GI/Hepatic negative GI ROS, Neg liver ROS,   Endo/Other  negative endocrine ROS  Renal/GU      Musculoskeletal   Abdominal   Peds  Hematology Hg 10.5 plt 282   Anesthesia Other Findings   Reproductive/Obstetrics (+) Pregnancy                             Anesthesia Physical Anesthesia Plan  ASA: III  Anesthesia Plan: Epidural   Post-op Pain Management:    Induction:   PONV Risk Score and Plan:   Airway Management Planned:   Additional Equipment:   Intra-op Plan:   Post-operative Plan:   Informed Consent: I have reviewed the patients History and Physical, chart, labs and discussed the procedure including the risks, benefits and alternatives for the proposed anesthesia with the patient or authorized representative who has indicated his/her understanding and acceptance.       Plan Discussed with:   Anesthesia Plan Comments: (39.2wk G1P0 w hx of asthma and Sz for LEA)        Anesthesia Quick Evaluation

## 2019-04-25 NOTE — Anesthesia Procedure Notes (Signed)
Epidural Patient location during procedure: OB Start time: 04/25/2019 4:34 PM End time: 04/25/2019 4:48 PM  Staffing Anesthesiologist: Trevor Iha, MD Performed: anesthesiologist   Preanesthetic Checklist Completed: patient identified, IV checked, site marked, risks and benefits discussed, surgical consent, monitors and equipment checked, pre-op evaluation and timeout performed  Epidural Patient position: sitting Prep: DuraPrep and site prepped and draped Patient monitoring: continuous pulse ox and blood pressure Approach: midline Location: L3-L4 Injection technique: LOR air  Needle:  Needle type: Tuohy  Needle gauge: 17 G Needle length: 9 cm and 9 Needle insertion depth: 7 cm Catheter type: closed end flexible Catheter size: 19 Gauge Catheter at skin depth: 12 cm Test dose: negative  Assessment Sensory level: T7 Events: blood not aspirated, injection not painful, no injection resistance, no paresthesia and negative IV test  Additional Notes Patient identified. Risks/Benefits/Options discussed with patient including but not limited to bleeding, infection, nerve damage, paralysis, failed block, incomplete pain control, headache, blood pressure changes, nausea, vomiting, reactions to medication both or allergic, itching and postpartum back pain. Confirmed with bedside nurse the patient's most recent platelet count. Confirmed with patient that they are not currently taking any anticoagulation, have any bleeding history or any family history of bleeding disorders. Patient expressed understanding and wished to proceed. All questions were answered. Sterile technique was used throughout the entire procedure. Please see nursing notes for vital signs. Test dose was given through epidural needle and negative prior to continuing to dose epidural or start infusion. Warning signs of high block given to the patient including shortness of breath, tingling/numbness in hands, complete motor  block, or any concerning symptoms with instructions to call for help. Patient was given instructions on fall risk and not to get out of bed. All questions and concerns addressed with instructions to call with any issues.1  Attempt (S) . Patient tolerated procedure well.

## 2019-04-26 ENCOUNTER — Encounter (HOSPITAL_COMMUNITY): Payer: Self-pay | Admitting: Obstetrics

## 2019-04-26 MED ORDER — SIMETHICONE 80 MG PO CHEW: 80.0000 mg | CHEWABLE_TABLET | ORAL | Status: DC | PRN

## 2019-04-26 MED ORDER — COCONUT OIL OIL: 1.0000 "application " | TOPICAL_OIL | Status: DC | PRN

## 2019-04-26 MED ORDER — GENTAMICIN SULFATE 40 MG/ML IJ SOLN
350.0000 mg | INTRAVENOUS | Status: DC
  Administered 2019-04-26: 350 mg via INTRAVENOUS
  Filled 2019-04-26: qty 8.75

## 2019-04-26 MED ORDER — DIPHENHYDRAMINE HCL 25 MG PO CAPS: 25.0000 mg | ORAL_CAPSULE | Freq: Four times a day (QID) | ORAL | Status: DC | PRN

## 2019-04-26 MED ORDER — DOCUSATE SODIUM 100 MG PO CAPS
100.0000 mg | ORAL_CAPSULE | Freq: Two times a day (BID) | ORAL | Status: DC
  Administered 2019-04-26 – 2019-04-28 (×4): 100 mg via ORAL
  Filled 2019-04-26 (×4): qty 1

## 2019-04-26 MED ORDER — SODIUM CHLORIDE 0.9 % IV SOLN: 250.0000 mL | INTRAVENOUS | Status: DC | PRN

## 2019-04-26 MED ORDER — ACETAMINOPHEN 500 MG PO TABS
1000.0000 mg | ORAL_TABLET | Freq: Once | ORAL | Status: AC
  Administered 2019-04-26: 1000 mg via ORAL
  Filled 2019-04-26: qty 2

## 2019-04-26 MED ORDER — PRENATAL MULTIVITAMIN CH
1.0000 | ORAL_TABLET | Freq: Every day | ORAL | Status: DC
  Administered 2019-04-26 – 2019-04-28 (×3): 1 via ORAL
  Filled 2019-04-26 (×3): qty 1

## 2019-04-26 MED ORDER — SODIUM CHLORIDE 0.9% FLUSH
3.0000 mL | Freq: Two times a day (BID) | INTRAVENOUS | Status: DC
  Administered 2019-04-26: 22:00:00 3 mL via INTRAVENOUS

## 2019-04-26 MED ORDER — WITCH HAZEL-GLYCERIN EX PADS: 1.0000 "application " | MEDICATED_PAD | CUTANEOUS | Status: DC | PRN

## 2019-04-26 MED ORDER — SODIUM CHLORIDE 0.9 % IV SOLN
2.0000 g | Freq: Four times a day (QID) | INTRAVENOUS | Status: DC
  Administered 2019-04-26 (×2): 2 g via INTRAVENOUS
  Filled 2019-04-26 (×2): qty 2000

## 2019-04-26 MED ORDER — TETANUS-DIPHTH-ACELL PERTUSSIS 5-2.5-18.5 LF-MCG/0.5 IM SUSP: 0.5000 mL | Freq: Once | INTRAMUSCULAR | Status: DC

## 2019-04-26 MED ORDER — SODIUM CHLORIDE 0.9% FLUSH: 3.0000 mL | INTRAVENOUS | Status: DC | PRN

## 2019-04-26 MED ORDER — IBUPROFEN 600 MG PO TABS
600.0000 mg | ORAL_TABLET | Freq: Four times a day (QID) | ORAL | Status: DC
  Administered 2019-04-26 – 2019-04-28 (×9): 600 mg via ORAL
  Filled 2019-04-26 (×9): qty 1

## 2019-04-26 MED ORDER — DIBUCAINE (PERIANAL) 1 % EX OINT: 1.0000 "application " | TOPICAL_OINTMENT | CUTANEOUS | Status: DC | PRN

## 2019-04-26 MED ORDER — OXYCODONE HCL 5 MG PO TABS
5.0000 mg | ORAL_TABLET | ORAL | Status: DC | PRN
  Administered 2019-04-26: 5 mg via ORAL
  Filled 2019-04-26: qty 1

## 2019-04-26 MED ORDER — ONDANSETRON HCL 4 MG/2ML IJ SOLN: 4.0000 mg | INTRAMUSCULAR | Status: DC | PRN

## 2019-04-26 MED ORDER — BENZOCAINE-MENTHOL 20-0.5 % EX AERO: 1.0000 "application " | INHALATION_SPRAY | CUTANEOUS | Status: DC | PRN

## 2019-04-26 MED ORDER — OXYCODONE HCL 5 MG PO TABS: 10.0000 mg | ORAL_TABLET | ORAL | Status: DC | PRN

## 2019-04-26 MED ORDER — ONDANSETRON HCL 4 MG PO TABS: 4.0000 mg | ORAL_TABLET | ORAL | Status: DC | PRN

## 2019-04-26 MED ORDER — ACETAMINOPHEN 325 MG PO TABS: 650.0000 mg | ORAL_TABLET | ORAL | Status: DC | PRN

## 2019-04-26 NOTE — Anesthesia Postprocedure Evaluation (Signed)
Anesthesia Post Note  Patient: Theresa Marshall  Procedure(s) Performed: AN AD HOC LABOR EPIDURAL     Patient location during evaluation: Mother Baby Anesthesia Type: Epidural Level of consciousness: awake and alert, oriented and patient cooperative Pain management: pain level controlled Vital Signs Assessment: post-procedure vital signs reviewed and stable Respiratory status: spontaneous breathing Cardiovascular status: stable Postop Assessment: no headache, epidural receding, patient able to bend at knees, no signs of nausea or vomiting and able to ambulate Anesthetic complications: no Comments: Pt. States she is walking.  Pain score 5.  Pt. States pain is manageable.     Last Vitals:  Vitals:   04/26/19 1005 04/26/19 1105  BP: 132/77 132/82  Pulse: 92 88  Resp: 16 18  Temp: 37.2 C 37.1 C  SpO2: 100% 100%    Last Pain:  Vitals:   04/26/19 1105  TempSrc: Oral  PainSc: 0-No pain   Pain Goal:                   [facility]

## 2019-04-26 NOTE — Progress Notes (Signed)
ANTIBIOTIC CONSULT NOTE - INITIAL  Pharmacy Consult for Gentamicin Indication: Maternal fever  Allergies  Allergen Reactions  . Metronidazole Other (See Comments)    Rash, had seizure after taking- saw neurologist, felt not related,  Seizures continued after stopping medication  . Pecan Nut (Diagnostic) Itching and Swelling    Patient is also allergic to walnuts.    Patient Measurements: Height: 5\' 6"  (167.6 cm) Weight: 173 lb (78.5 kg) IBW/kg (Calculated) : 59.3 Adjusted Body Weight: 67 kg  Vital Signs: Temp: 101.2 F (38.4 C) (01/08 0001) Temp Source: Oral (01/08 0001) BP: 131/61 (01/08 0001) Pulse Rate: 113 (01/08 0001)  Labs: Recent Labs    04/25/19 1303  WBC 8.2  HGB 10.5*  PLT 282   No results for input(s): GENTTROUGH, GENTPEAK, GENTRANDOM in the last 72 hours.   Microbiology: Recent Results (from the past 720 hour(s))  OB RESULT CONSOLE Group B Strep     Status: None   Collection Time: 04/10/19 12:00 AM  Result Value Ref Range Status   GBS Negative  Final  Respiratory Panel by RT PCR (Flu A&B, Covid) - Nasopharyngeal Swab     Status: None   Collection Time: 04/25/19 12:36 PM   Specimen: Nasopharyngeal Swab  Result Value Ref Range Status   SARS Coronavirus 2 by RT PCR NEGATIVE NEGATIVE Final    Comment: (NOTE) SARS-CoV-2 target nucleic acids are NOT DETECTED. The SARS-CoV-2 RNA is generally detectable in upper respiratoy specimens during the acute phase of infection. The lowest concentration of SARS-CoV-2 viral copies this assay can detect is 131 copies/mL. A negative result does not preclude SARS-Cov-2 infection and should not be used as the sole basis for treatment or other patient management decisions. A negative result may occur with  improper specimen collection/handling, submission of specimen other than nasopharyngeal swab, presence of viral mutation(s) within the areas targeted by this assay, and inadequate number of viral copies (<131  copies/mL). A negative result must be combined with clinical observations, patient history, and epidemiological information. The expected result is Negative. Fact Sheet for Patients:  PinkCheek.be Fact Sheet for Healthcare Providers:  GravelBags.it This test is not yet ap proved or cleared by the [place] FDA and  has been authorized for detection and/or diagnosis of SARS-CoV-2 by FDA under an Emergency Use Authorization (EUA). This EUA will remain  in effect (meaning this test can be used) for the duration of the COVID-19 declaration under Section 564(b)(1) of the Act, 21 U.S.C. section 360bbb-3(b)(1), unless the authorization is terminated or revoked sooner.    Influenza A by PCR NEGATIVE NEGATIVE Final   Influenza B by PCR NEGATIVE NEGATIVE Final    Comment: (NOTE) The Xpert Xpress SARS-CoV-2/FLU/RSV assay is intended as an aid in  the diagnosis of influenza from Nasopharyngeal swab specimens and  should not be used as a sole basis for treatment. Nasal washings and  aspirates are unacceptable for Xpert Xpress SARS-CoV-2/FLU/RSV  testing. Fact Sheet for Patients: PinkCheek.be Fact Sheet for Healthcare Providers: GravelBags.it This test is not yet approved or cleared by the [place] FDA and  has been authorized for detection and/or diagnosis of SARS-CoV-2 by  FDA under an Emergency Use Authorization (EUA). This EUA will remain  in effect (meaning this test can be used) for the duration of the  Covid-19 declaration under Section 564(b)(1) of the Act, 21  U.S.C. section 360bbb-3(b)(1), unless the authorization is  terminated or revoked. Performed at [facility], [place] [address] [place], [place]  [address]     Assessment: 24 y.o. female G1P0 at [redacted]w[redacted]d   Goal of Therapy:  Gentamicin peak 6-8 mg/L and Trough < 1 mg/L  Plan:  Gentamicin 350 mg (5  mg/kg) IV x 24 hours  Check Scr with next labs if gentamicin continued. Will check gentamicin levels if continued > 72hr or clinically indicated.   Thank you for allowing [place] to participate in this patients care. Signe Colt, PharmD 04/26/2019,12:46 AM

## 2019-04-26 NOTE — Progress Notes (Signed)
Alerted by RN of maternal fever  BP (!) 113/57   Pulse (!) 109   Temp (!) 101.2 F (38.4 C) (Oral)   Resp 16   Ht 5\' 6"  (1.676 m)   Wt 78.5 kg   LMP 07/12/2018   SpO2 100%   BMI 27.92 kg/m   Toco: q46min EFM: currently 150s, moderate variability, + accels, category 1  A/P:  Maternal fever / tachycardia in setting of ROM consistent with chorioamnionitis.  Will treat with 1 gm acetaminophen now, ampicillin / gentamicin.  Category 1 tracing.  Continue pitocin

## 2019-04-26 NOTE — Lactation Note (Signed)
This note was copied from a baby's chart. Lactation Consultation Note;  P1. Infant is 7 hours old.  Mother reports that infant had just fed for 20 mins.  Mother reports that staff nurse assist with hand expression and mother observed colostrum.   Reviewed hand expression and advised to continue to hand express before and after feedings.   Discussed cues with parents. Advised to breastfeed infant with all feeding cues. Mother very sleepy. Encouraged to nap frequently.  Discussed the second night of life and informed of cluster feeding.  Advised to breast feed infant 8-12 times or more in 24 hours.  Parents receptive to all teaching. Father at the bedside and very supportive.   Basic teaching done with parents. [facility] brochure was given. And informed of available LC services.  Mother to page [facility] or staff nurse when infant is ready to feed next.   Patient Name: Girl Cayleigh Paull [id number] Date: 04/26/2019 Reason for consult: Initial assessment   Maternal Data Has patient been taught Hand Expression?: Yes Does the patient have breastfeeding experience prior to this delivery?: No  Feeding Feeding Type: Breast Fed  LATCH Score                   Interventions Interventions: Breast feeding basics reviewed(mother to page LC to check latch)  Lactation Tools Discussed/Used WIC Program: No   Consult Status Consult Status: Follow-up Date: 04/27/19 Follow-up type: In-patient    Stevan Born [facility] 04/26/2019, 3:53 PM

## 2019-04-27 LAB — CBC
HCT: 27.3 % — ABNORMAL LOW (ref 36.0–46.0)
Hemoglobin: 8.6 g/dL — ABNORMAL LOW (ref 12.0–15.0)
MCH: 26.7 pg (ref 26.0–34.0)
MCHC: 31.5 g/dL (ref 30.0–36.0)
MCV: 84.8 fL (ref 80.0–100.0)
Platelets: 237 10*3/uL (ref 150–400)
RBC: 3.22 MIL/uL — ABNORMAL LOW (ref 3.87–5.11)
RDW: 15 % (ref 11.5–15.5)
WBC: 13.5 10*3/uL — ABNORMAL HIGH (ref 4.0–10.5)
nRBC: 0 % (ref 0.0–0.2)

## 2019-04-27 MED ORDER — METRONIDAZOLE 500 MG PO TABS
2000.0000 mg | ORAL_TABLET | Freq: Once | ORAL | Status: AC
  Administered 2019-04-27: 2000 mg via ORAL
  Filled 2019-04-27: qty 4

## 2019-04-27 MED ORDER — IBUPROFEN 600 MG PO TABS: 600.0000 mg | ORAL_TABLET | Freq: Four times a day (QID) | ORAL | 0 refills | Status: DC

## 2019-04-27 MED ORDER — ACETAMINOPHEN 325 MG PO TABS: 650.0000 mg | ORAL_TABLET | ORAL | 0 refills | Status: AC | PRN

## 2019-04-27 MED ORDER — FERROUS SULFATE 325 (65 FE) MG PO TABS: 325.0000 mg | ORAL_TABLET | Freq: Every day | ORAL | 11 refills | Status: AC

## 2019-04-27 MED ORDER — DOCUSATE SODIUM 100 MG PO CAPS: 100.0000 mg | ORAL_CAPSULE | Freq: Two times a day (BID) | ORAL | 0 refills | Status: AC

## 2019-04-27 MED ORDER — OXYCODONE-ACETAMINOPHEN 5-325 MG PO TABS: 1.0000 | ORAL_TABLET | Freq: Four times a day (QID) | ORAL | 0 refills | Status: AC | PRN

## 2019-04-27 NOTE — Clinical Social Work Maternal (Signed)
CLINICAL SOCIAL WORK MATERNAL/CHILD NOTE  Patient Details  Name: Theresa Marshall MRN: [id number] Date of Birth: 01/16/1996  Date:  04/27/2019  Clinical Social Worker Initiating Note:  Kimberly Long, LCSW Date/Time: Initiated:  04/27/19/1205     Child's Name:  Theresa Marshall   Biological Parents:  Mother, Father(Father: Theresa Marshall)   Need for Interpreter:  None   Reason for Referral:  Current Substance Use/Substance Use During Pregnancy  Behavioral Health Concerns   Address:  [address] [place] [address] Permanent Mailing Address  Physical Address: [address] [place] [address]   Phone number:  [phone] (home)     Additional phone number:   Household Members/Support Persons (HM/SP):   Household Member/Support Person 1, Household Member/Support Person 2   HM/SP Name Relationship DOB or Age  HM/SP -1 Theresa Marshall FOB 12/16/1996  HM/SP -2   FOB's roommate    HM/SP -3        HM/SP -4        HM/SP -5        HM/SP -6        HM/SP -7        HM/SP -8          Natural Supports (not living in the home):  Immediate Family, Extended Family, Friends   Professional Supports: None   Employment: Student   Type of Work:     Education:  Attending college   Homebound arranged:    Financial Resources:  Private Insurance   Other Resources:  Food Stamps (Plans to apply for WIC)   Cultural/Religious Considerations Which May Impact Care:    Strengths:  Ability to meet basic needs , Pediatrician chosen, Home prepared for child    Psychotropic Medications:         Pediatrician:    [place] area  Pediatrician List:   [place] Other([facility] (Dr. Anderson))  [place]    [place]    [place]    [place]    [place]      Pediatrician Fax Number:    Risk Factors/Current Problems:  None   Cognitive State:  Able to Concentrate , Insightful , Goal Oriented , Linear  Thinking    Mood/Affect:  Interested , Calm , Tearful    CSW Assessment: CSW met with MOB at bedside to discuss consult for behavioral health concerns and substance use during pregnancy. CSW introduced self and explained reason for consult. MOB was welcoming, pleasant and engaged during assessment. MOB reported that she resides with FOB and FOB's roommate. MOB reported that she currently in school at [employer] and studying business administration. 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 MOB reported that she receives food stamps and plans to re-apply for WIC because it expired in December. MOB reported that she has all items needed to care for infant including 2 car seats and a crib. CSW inquired about MOB's support system, MOB reported that she has a lot of support from her immediate family, FOB, FOB's family, infant's 2 god parents and friends.   MOB reported that infant would sleep in crib and they planned to co- sleep. CSW informed MOB about the dangers of co-sleeping and safety concerns. CSW provided review of Sudden Infant Death Syndrome (SIDS) precautions. MOB verbalized understanding and reported that infant would sleep in crib.  MOB appeared   receptive to information provided.   CSW inquired about MOB's mental health history. MOB reported that she was diagnosed with Anxiety in 2018 and was taking xanax. MOB reported that she has not taken medication in 2 years for anxiety and denied any symptoms of anxiety. CSW inquired about how MOB was feeling emotionally after giving birth, MOB reported that she was feeling a little overwhelmed due to breast feeding. MOB became tearful when talking about difficulties with breast feeding. CSW acknowledged and normalized MOB's feelings and experience with breast feeding. CSW provided emotional support and encouraged MOB to not internalize her  difficulties with breast feeding. CSW praised MOB for her efforts with breast feeding and encouraged her to keep trying. MOB appeared receptive to conversation. MOB presented calm and tearful when discussing breast feeding. MOB did not demonstrate any acute mental health signs/symptoms. CSW assessed for safety, MOB denied SI, HI and domestic violence.   CSW provided education regarding the baby blues period vs. perinatal mood disorders, discussed treatment and gave resources for mental health follow up if concerns arise.  CSW recommends self-evaluation during the postpartum time period using the New Mom Checklist from Postpartum Progress and encouraged MOB to contact a medical professional if symptoms are noted at any time.     CSW informed MOB about the hospital drug screen policy due to substance use during pregnancy. MOB confirmed marijuana use prior to finding out that she was pregnant. MOB reported that her last use was in 08/2018 before she found out about pregnancy. MOB reported that she didn't plan to start using marijuana again and denied any other substance use during pregnancy. CSW informed MOB that UDS and CDS would continue to be monitored and that a CPS report would be made if warranted. MOB verbalized understanding and denied any questions/concerns.   Infant's UDS was negative, CSW will continue to monitor CDS. No barriers to discharge.     CSW Plan/Description:  Perinatal Mood and Anxiety Disorder (PMADs) Education, Sudden Infant Death Syndrome (SIDS) Education, No Further Intervention Required/No Barriers to Discharge, [place], CSW Will Continue to Monitor Umbilical Cord Tissue Drug Screen Results and Make Report if Barbette Or, LCSW 04/27/2019, 12:08 PM

## 2019-04-27 NOTE — Discharge Instructions (Signed)
Prescriptions Motrin 600mg every 6 hours for pain Acetaminophen 650mg every 6 hours for moderate pain Percocet (Oxycodone 5mg-acetaminophen 325mg) every 4 hours for severe pain.  Make sure to not exceed acetaminophen 3000mg every day.  Postpartum Care After Vaginal Delivery This sheet gives you information about how to care for yourself from the time you deliver your baby to up to 6-12 weeks after delivery (postpartum period). Your health care provider may also give you more specific instructions. If you have problems or questions, contact your health care provider. Follow these instructions at home: Vaginal bleeding  It is normal to have vaginal bleeding (lochia) after delivery. Wear a sanitary pad for vaginal bleeding and discharge. ? During the first week after delivery, the amount and appearance of lochia is often similar to a menstrual period. ? Over the next few weeks, it will gradually decrease to a dry, yellow-brown discharge. ? For most women, lochia stops completely by 4-6 weeks after delivery. Vaginal bleeding can vary from woman to woman.  Change your sanitary pads frequently. Watch for any changes in your flow, such as: ? A sudden increase in volume. ? A change in color. ? Large blood clots.  If you pass a blood clot from your vagina, save it and call your health care provider to discuss. Do not flush blood clots down the toilet before talking with your health care provider.  Do not use tampons or douches until your health care provider says this is safe.  If you are not breastfeeding, your period should return 6-8 weeks after delivery. If you are feeding your child breast milk only (exclusive breastfeeding), your period may not return until you stop breastfeeding. Perineal care  Keep the area between the vagina and the anus (perineum) clean and dry as told by your health care provider. Use medicated pads and pain-relieving sprays and creams as directed.  If you had a cut  in the perineum (episiotomy) or a tear in the vagina, check the area for signs of infection until you are healed. Check for: ? More redness, swelling, or pain. ? Fluid or blood coming from the cut or tear. ? Warmth. ? Pus or a bad smell.  You may be given a squirt bottle to use instead of wiping to clean the perineum area after you go to the bathroom. As you start healing, you may use the squirt bottle before wiping yourself. Make sure to wipe gently.  To relieve pain caused by an episiotomy, a tear in the vagina, or swollen veins in the anus (hemorrhoids), try taking a warm sitz bath 2-3 times a day. A sitz bath is a warm water bath that is taken while you are sitting down. The water should only come up to your hips and should cover your buttocks. Breast care  Within the first few days after delivery, your breasts may feel heavy, full, and uncomfortable (breast engorgement). Milk may also leak from your breasts. Your health care provider can suggest ways to help relieve the discomfort. Breast engorgement should go away within a few days.  If you are breastfeeding: ? Wear a bra that supports your breasts and fits you well. ? Keep your nipples clean and dry. Apply creams and ointments as told by your health care provider. ? You may need to use breast pads to absorb milk that leaks from your breasts. ? You may have uterine contractions every time you breastfeed for up to several weeks after delivery. Uterine contractions help your uterus return to   its normal size. ? If you have any problems with breastfeeding, work with your health care provider or lactation consultant.  If you are not breastfeeding: ? Avoid touching your breasts a lot. Doing this can make your breasts produce more milk. ? Wear a good-fitting bra and use cold packs to help with swelling. ? Do not squeeze out (express) milk. This causes you to make more milk. Intimacy and sexuality  Ask your health care provider when you can  engage in sexual activity. This may depend on: ? Your risk of infection. ? How fast you are healing. ? Your comfort and desire to engage in sexual activity.  You are able to get pregnant after delivery, even if you have not had your period. If desired, talk with your health care provider about methods of birth control (contraception). Medicines  Take over-the-counter and prescription medicines only as told by your health care provider.  If you were prescribed an antibiotic medicine, take it as told by your health care provider. Do not stop taking the antibiotic even if you start to feel better. Activity  Gradually return to your normal activities as told by your health care provider. Ask your health care provider what activities are safe for you.  Rest as much as possible. Try to rest or take a nap while your baby is sleeping. Eating and drinking   Drink enough fluid to keep your urine pale yellow.  Eat high-fiber foods every day. These may help prevent or relieve constipation. High-fiber foods include: ? Whole grain cereals and breads. ? Brown rice. ? Beans. ? Fresh fruits and vegetables.  Do not try to lose weight quickly by cutting back on calories.  Take your prenatal vitamins until your postpartum checkup or until your health care provider tells you it is okay to stop. Lifestyle  Do not use any products that contain nicotine or tobacco, such as cigarettes and e-cigarettes. If you need help quitting, ask your health care provider.  Do not drink alcohol, especially if you are breastfeeding. General instructions  Keep all follow-up visits for you and your baby as told by your health care provider. Most women visit their health care provider for a postpartum checkup within the first 3-6 weeks after delivery. Contact a health care provider if:  You feel unable to cope with the changes that your child brings to your life, and these feelings do not go away.  You feel unusually  sad or worried.  Your breasts become red, painful, or hard.  You have a fever.  You have trouble holding urine or keeping urine from leaking.  You have little or no interest in activities you used to enjoy.  You have not breastfed at all and you have not had a menstrual period for 12 weeks after delivery.  You have stopped breastfeeding and you have not had a menstrual period for 12 weeks after you stopped breastfeeding.  You have questions about caring for yourself or your baby.  You pass a blood clot from your vagina. Get help right away if:  You have chest pain.  You have difficulty breathing.  You have sudden, severe leg pain.  You have severe pain or cramping in your lower abdomen.  You bleed from your vagina so much that you fill more than one sanitary pad in one hour. Bleeding should not be heavier than your heaviest period.  You develop a severe headache.  You faint.  You have blurred vision or spots in your vision.    You have bad-smelling vaginal discharge.  You have thoughts about hurting yourself or your baby. If you ever feel like you may hurt yourself or others, or have thoughts about taking your own life, get help right away. You can go to the nearest emergency department or call:  Your local emergency services (911 in the [place]  A suicide crisis helpline, such as the National Suicide Prevention Lifeline at 1-800-273-8255. This is open 24 hours a day. Summary  The period of time right after you deliver your newborn up to 6-12 weeks after delivery is called the postpartum period.  Gradually return to your normal activities as told by your health care provider.  Keep all follow-up visits for you and your baby as told by your health care provider. This information is not intended to replace advice given to you by your health care provider. Make sure you discuss any questions you have with your health care provider. Document Revised: 04/07/2017 Document  Reviewed: 01/16/2017 Elsevier Patient Education  2020 Elsevier Inc.  

## 2019-04-27 NOTE — Progress Notes (Signed)
Post Partum Day 1 Subjective: no complaints, up ad lib, voiding and tolerating PO Desires discharge home today if baby can go too  Objective: Patient Vitals for the past 24 hrs:  BP Temp Temp src Pulse Resp SpO2  04/27/19 0503 115/63 97.9 F (36.6 C) Oral 72 17 98 %  04/26/19 2350 112/67 (!) 97.5 F (36.4 C) Oral 76 18 97 %  04/26/19 1924 138/88 97.8 F (36.6 C) Oral 91 18 97 %  04/26/19 1525 118/79 97.9 F (36.6 C) Oral 97 20 --  04/26/19 1105 132/82 98.8 F (37.1 C) Oral 88 18 100 %  04/26/19 1005 132/77 99 F (37.2 C) Oral 92 16 100 %  04/26/19 0901 140/71 -- -- (!) 101 16 --  04/26/19 0845 132/78 -- -- (!) 105 18 --  04/26/19 0830 (!) 143/59 99.4 F (37.4 C) Oral (!) 107 16 --  04/26/19 0730 135/72 -- -- (!) 117 18 --    Physical Exam:  General: alert and no distress Lochia: appropriate Uterine Fundus: firm DVT Evaluation: No evidence of DVT seen on physical exam.  Recent Labs    04/25/19 1303 04/27/19 0430  WBC 8.2 13.5*  HGB 10.5* 8.6*  HCT 33.7* 27.3*  PLT 282 237   Assessment/Plan: Discharge home  Theresa Marshall 23 y.o. G1P1001 PPD#1 sp TSVD 1. PPC: 1st degree repaired, EBL 117, Hgb 10.5>8.6 2. Trich during pregnancy,will treat now with flagyl 2g 3. Rubella immune, Blood type O+, breastfeeding, baby girl in room   LOS: 2 days   Theresa Marshall 04/27/2019, 7:01 AM

## 2019-04-27 NOTE — Lactation Note (Signed)
This note was copied from a baby's chart. Lactation Consultation Note  Patient Name: Girl Marialy Urbanczyk [id number] Date: 04/27/2019 Reason for consult: Follow-up assessment  Infant is 73 hrs old. I assisted Mom with latching. I noticed that infant dimpled when at breast (in various holds). Mom does not hurt with nursing and her nipple shape is not distorted when infant releases latch. Some of the jaw movements appeared to be consistent with swallowing, but was unable to verify with cervical auscultation b/c of infant's positioning.   I spent time with Mom optimizing positioning, etc. On oral exam, infant has a great suck & is easily able to draw a finger to the juncture of the hard & soft palate. The tongue does have a slight divet at the tip with lateralization, but overall, the tongue seems to have good movement.   If the dimpling seems to be interfering with milk transfer, Mom might be a good candidate for a nipple shield. However, at this time, it appears that infant may be transferring milk regardless of dimpling.  [name] [facility] 04/27/2019, 12:23 PM

## 2019-04-27 NOTE — Discharge Summary (Signed)
OB Discharge Summary     Patient Name: Theresa Marshall DOB: 01-10-1996 MRN: [id number]  Date of admission: 04/25/2019 Delivering MD: Janey Greaser K   Date of discharge: 04/28/2019  Admitting diagnosis: Full-term premature rupture of membranes (PROM) with unknown onset of labor [O42.90] Intrauterine pregnancy: [redacted]w[redacted]d     Secondary diagnosis:  Active Problems:   Full-term premature rupture of membranes (PROM) with unknown onset of labor   Vaginal delivery  Additional problems: history of seizure disorder, trichomonas, asthma     Discharge diagnosis: Term Pregnancy Delivered                                                                                                Post partum procedures:none  Augmentation: Pitocin  Complications: None  Hospital course:  Onset of Labor With Vaginal Delivery     23 y.o. yo G1P1001 at [redacted]w[redacted]d was admitted in Latent Labor  And SROM on 04/25/2019. Augemented with pitocin. Patient had an uncomplicated labor course as follows:  Membrane Rupture Time/Date: 10:45 AM ,04/25/2019   Intrapartum Procedures: Episiotomy: None [1]                                         Lacerations:  1st degree [2];Perineal [11];Periurethral [8]  Patient had a delivery of a Viable infant. 04/26/2019  Information for the patient's newborn:  Omnia, Dollinger [[id number]]  Delivery Method: Vaginal, Spontaneous(Filed from Delivery Summary)     Pateint had an uncomplicated postpartum course. Treated for trichomonas postpartum without adverse reaction to flagyl. One time elevated BP morning of discharge, repeat normotensive. She is ambulating, tolerating a regular diet, passing flatus, and urinating well. Patient is discharged home in stable condition on 04/28/19.   Physical exam  Vitals:   04/27/19 1348 04/27/19 2153 04/28/19 0545 04/28/19 1015  BP: 130/71 134/67 (!) 141/76 124/88  Pulse: 72 64 78 61  Resp: 18 15  16   Temp: 98.4 F (36.9 C) 98.6 F (37 C) 97.7 F (36.5  C) 98 F (36.7 C)  TempSrc: Oral Oral Oral Oral  SpO2: 99% 100% 99% 97%  Weight:      Height:       General: alert Lochia: appropriate Uterine Fundus: firm DVT Evaluation: No evidence of DVT seen on physical exam. Labs: Lab Results  Component Value Date   WBC 13.5 (H) 04/27/2019   HGB 8.6 (L) 04/27/2019   HCT 27.3 (L) 04/27/2019   MCV 84.8 04/27/2019   PLT 237 04/27/2019   CMP Latest Ref Rng & Units 04/15/2019  Glucose 70 - 99 mg/dL 81  BUN 6 - 20 mg/dL 7  Creatinine 04/17/2019 - 9.51 mg/dL 8.84  Sodium 1.66 - 063 mmol/L 138  Potassium 3.5 - 5.1 mmol/L 3.5  Chloride 98 - 111 mmol/L 108  CO2 22 - 32 mmol/L 22  Calcium 8.9 - 10.3 mg/dL 9.1  Total Protein 6.5 - 8.1 g/dL 6.4(L)  Total Bilirubin 0.3 - 1.2 mg/dL 0.8  Alkaline Phos 38 - 126 U/L  280(H)  AST 15 - 41 U/L 20  ALT 0 - 44 U/L 13    Discharge instruction: per After Visit Summary and "Baby and Me Booklet".  After visit meds:  Allergies as of 04/28/2019      Reactions   Metronidazole Other (See Comments)   Rash, had seizure after taking- saw neurologist, felt not related,  Seizures continued after stopping medication   Pecan Nut (diagnostic) Itching, Swelling   Patient is also allergic to walnuts.      Medication List    STOP taking these medications   iron polysaccharides 150 MG capsule Commonly known as: NIFEREX     TAKE these medications   acetaminophen 325 MG tablet Commonly known as: Tylenol Take 2 tablets (650 mg total) by mouth every 4 (four) hours as needed (for pain scale < 4).   butalbital-acetaminophen-caffeine 50-325-40 MG tablet Commonly known as: FIORICET Take 1 tablet as needed for severe headache. Do not take more than 2-3 a week   [place] 1 Units by Does not apply route daily.   docusate sodium 100 MG capsule Commonly known as: COLACE Take 1 capsule (100 mg total) by mouth 2 (two) times daily.   ferrous sulfate 325 (65 FE) MG tablet Commonly known as:  FerrouSul Take 1 tablet (325 mg total) by mouth daily with breakfast.   ibuprofen 600 MG tablet Commonly known as: ADVIL Take 1 tablet (600 mg total) by mouth every 6 (six) hours.   Magnesium 400 MG Caps Take 1 capsule daily   multivitamin-prenatal 27-0.8 MG Tabs tablet Take 1 tablet by mouth daily at 12 noon.   oxyCODONE-acetaminophen 5-325 MG tablet Commonly known as: Percocet Take 1 tablet by mouth every 6 (six) hours as needed for severe pain.   Ventolin HFA 108 (90 Base) MCG/ACT inhaler Generic drug: albuterol Inhale 2 puffs into the lungs every 6 (six) hours as needed for wheezing.       Diet: routine diet  Activity: Advance as tolerated. Pelvic rest for 6 weeks.   Outpatient follow up:4 weeks Follow up Appt: Future Appointments  Date Time Provider [place]  04/30/2019 11:30 AM Cameron Sprang, MD [facility] None   Follow up Visit:No follow-ups on file.  Postpartum contraception: defer to postpartum visit  Newborn Data: Live born female  Birth Weight: 7 lb 12.5 oz (3530 g) APGAR: 69, 9  Newborn Delivery   Birth date/time: 04/26/2019 08:01:00 Delivery type: Vaginal, Spontaneous      Baby Feeding: Breast Disposition:home with mother   04/28/2019 Jonelle Sidle, MD

## 2019-04-28 NOTE — Progress Notes (Signed)
Post Partum Day 2 Subjective: no complaints, up ad lib, voiding and tolerating PO Remained inpatient overnight 2/2 baby Objective: Patient Vitals for the past 24 hrs:  BP Temp Temp src Pulse Resp SpO2  04/28/19 0545 (!) 141/76 97.7 F (36.5 C) Oral 78 -- 99 %  04/27/19 2153 134/67 98.6 F (37 C) Oral 64 15 100 %  04/27/19 1348 130/71 98.4 F (36.9 C) Oral 72 18 99 %    Physical Exam:  General: alert and no distress Lochia: appropriate Uterine Fundus: firm DVT Evaluation: No evidence of DVT seen on physical exam.  Recent Labs    04/25/19 1303 04/27/19 0430  WBC 8.2 13.5*  HGB 10.5* 8.6*  HCT 33.7* 27.3*  PLT 282 237   Assessment/Plan: Discharge home  Ceyda N Butson 24 y.o. G1P1001 PPD#2 sp TSVD. Plan for discharge today after repeat BP 1. PPC: 1st degree repaired, EBL 117, Hgb 10.5>8.6. Will discharge with PO iron 2. Trich during pregnancy, treated with flagyl 2g pp, no issues 3. Elevated BP this AM. Also had mild range but at time of delivery. Will repeat this AM Rubella immune, Blood type O+, breastfeeding, baby girl in room   LOS: 3 days   Chaz Ronning K Taam-Akelman 04/28/2019, 7:47 AM

## 2019-04-28 NOTE — Lactation Note (Signed)
This note was copied from a baby's chart. Lactation Consultation Note  Patient Name: Theresa Marshall UYQIH'K Date: 04/28/2019    "[name]" was finishing a feeding as I entered room. She released latch, content. Mom's nipple shape was not distorted when infant released latch. Mom's breasts are fuller today than yesterday & she says that her breasts feel softer after feedings.   Improvement in feeding (on behalf of Mom & baby) noted since yesterday. Mom is very pleased. I have no concerns.   [name] [facility] 04/28/2019, 9:07 AM

## 2019-04-28 NOTE — Progress Notes (Signed)
[place]  04/28/2019  Theresa Marshall Jun 17, 1995 [id number]   CSW received consult for hx of Anxiety.  CSW met with MOB to offer support.  MOB denies experiencing symptoms of Anxiety at present, nor is MOB interested in receiving psychotropic medications or psychotherapy services.  MOB is excited about returning home to start her life with her new baby.  MOB admitted to having a great support system.  MOB denies any additional social work needs at present.  CSW provided education regarding the baby blues period vs. perinatal mood disorders, discussed treatment and gave resources for mental health follow up if concerns arise.  CSW recommends self-evaluation during the postpartum time period using the New Mom Checklist from Postpartum Progress and encouraged MOB to contact a medical professional if symptoms are noted at any time.   CSW provided review of Sudden Infant Death Syndrome (SIDS) precautions.   CSW identifies no further need for intervention and no barriers to discharge at this time.CSW received consult for hx of Anxiety and Depression.  CSW met with MOB to offer support and complete assessment.    Nat Christen, BSW, MSW, LCSW  Licensed Education officer, [employer] [facility]  Mailing [place] [address] [place], [place] [address] Physical Address-[address], [place], [place] [address] Toll Free Main # [phone] Fax # [phone] Cell # [phone]  Office # [phone] [email or web address]

## 2019-04-29 ENCOUNTER — Encounter: Payer: Self-pay | Admitting: Neurology

## 2019-04-29 LAB — SURGICAL PATHOLOGY

## 2019-04-30 ENCOUNTER — Other Ambulatory Visit: Payer: Self-pay

## 2019-04-30 ENCOUNTER — Telehealth (INDEPENDENT_AMBULATORY_CARE_PROVIDER_SITE_OTHER): Payer: Managed Care, Other (non HMO) | Admitting: Neurology

## 2019-04-30 VITALS — Ht 68.0 in | Wt 173.0 lb

## 2019-04-30 DIAGNOSIS — G43009 Migraine without aura, not intractable, without status migrainosus: Secondary | ICD-10-CM

## 2019-04-30 DIAGNOSIS — R55 Syncope and collapse: Secondary | ICD-10-CM | POA: Diagnosis not present

## 2019-04-30 NOTE — Progress Notes (Signed)
Virtual Visit via Video Note The purpose of this virtual visit is to provide medical care while limiting exposure to the novel coronavirus.    Consent was obtained for video visit:  Yes.   Answered questions that patient had about telehealth interaction:  Yes.   I discussed the limitations, risks, security and privacy concerns of performing an evaluation and management service by telemedicine. I also discussed with the patient that there may be a patient responsible charge related to this service. The patient expressed understanding and agreed to proceed.  Pt location: Patient's private vehicle Physician Location: office Name of referring provider:  Medicine, [place] connected with Theresa Marshall at patients initiation/request on 04/30/2019 at 11:30 AM EST by video enabled telemedicine application and verified that I am speaking with the correct person using two identifiers. Pt MRN:  [id number] Pt DOB:  [birth year] Video Participants:  Theresa Marshall   History of Present Illness:  The patient was seen as a virtual video visit on 04/30/2019. She was last seen 4 months ago for evaluation of seizures and worsening headaches during her pregnancy. Since her last visit, she has delivered a healthy baby girl. We had discussed doing an MRI/MRV which was not completed, she reports that after a week of taking the as needed headache medication (Fioricet), she stopped it and has not needed it for the duration of her pregnancy. She states the headaches are not frequent, she has had a couple of milder headaches since her delivery that were not migraines, "just fatigue headaches." She is breastfeeding. She reports a stress seizure on 04/21/2019, 4 days prior to her hospital admission. She was in the shower and felt dizzy. She recalls thinking it was too hot, she got out of the shower and felt very weak, calling out of help. She does not recall what happened after, then woke up on the floor. It took her a  minute to come out of it, then passed out another time. No convulsive activity reported. Per OB notes, she "went still and looked dazed." No tongue bite or incontinence. She evaluated and discharged home improved. No further similar episodes. Her mother denies any staring/unresponsive episodes. She denies any focal numbness/tingling/weakness.    History on Initial Assessment 12/21/2018: This is a 24 year old right-handed G1P0 woman at [redacted] weeks gestation presenting for evaluation of seizures and worsening headaches. Seizures started around age 18 where her body would feel like deadweight, vision gets blurred, she can hear people around her unable to move. She reports "sometimes light convulsions but never grand mal." Her last seizure was in December 2019/January 2020 where she had a bad headache with light sensitivity, her vision went completely and she was unresponsive. Her mother reported a dull stare. She has left shoulder numbness before a seizure, and her left side feels weaker after.  She was also told she has them in sleep, looking like tremors, no tongue bite or incontinence. She was started on Keppra in 2014. She was having 3 or 4 seizures a day and Keppra quieted them down a little, then this was stopped in March 2015 "because they could not find out why she was having seizures" and told they were anxiety-induced or stress seizures.   She has a history of bad migraines since 2014. She had daith piercing in 2016 and migraines got better. Headaches recurred since the start of her pregnancy, they are different than her migraines, more painful and lasting 2-3 days. There is light nausea,  her only relief is closing her eyes. Pain is dull and achy in the right temporal region. There is dizziness and nausea when more intense. Tylenol does not help. She has headaches every 2-3 days. There is no neck pain, focal numbness/tinglimg/weakness. She has some back pain, no bowel/bladder dysfunction. Her sister has  migraines. No family history of seizures.  She had a normal birth and early development.  There is no history of febrile convulsions, CNS infections such as meningitis/encephalitis, significant traumatic brain injury, neurosurgical procedures, or family history of seizures.   PAST MEDICAL HISTORY: Past Medical History:  Diagnosis Date  . Anemia   . Anxiety   . Infection    UTI, yeast  . Ovarian cyst   . Pregnancy   . Seizures (HCC)   . Vaginosis     PAST SURGICAL HISTORY: Past Surgical History:  Procedure Laterality Date  . APPENDECTOMY  2008    MEDICATIONS: Current Outpatient Medications on File Prior to Visit  Medication Sig Dispense Refill  . Prenatal Vit-Fe Fumarate-FA (MULTIVITAMIN-PRENATAL) 27-0.8 MG TABS tablet Take 1 tablet by mouth daily at 12 noon.    . VENTOLIN HFA 108 (90 Base) MCG/ACT inhaler Inhale 2 puffs into the lungs every 6 (six) hours as needed for wheezing. 6.7 g 1   No current facility-administered medications on file prior to visit.     ALLERGIES: Allergies  Allergen Reactions  . Metronidazole Other (See Comments)    Rash, had seizure after taking- saw neurologist, felt not related,  Seizures continued after stopping medication  . Pecan Nut (Diagnostic) Itching and Swelling    Patient is also allergic to walnuts.    FAMILY HISTORY: Family History  Problem Relation Age of Onset  . Healthy Mother   . Healthy Father      Observations/Objective:   Vitals:   04/29/19 1447  Weight: 173 lb (78.5 kg)  Height: 5\' 8"  (1.727 m)   GEN:  The patient appears stated age and is in NAD.  Neurological examination: Patient is awake, alert, oriented x 3. No aphasia or dysarthria. Intact fluency and comprehension. Remote and recent memory intact. Able to name and repeat. Cranial nerves: Extraocular movements intact with no nystagmus. No facial asymmetry. Motor: moves all extremities symmetrically, at least anti-gravity x 4.   Assessment and Plan:   This  is a 24 yo RH woman who presented for evaluation of seizures and headaches. 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 She reports a "seizure" last 04/21/2019, more suggestive of syncope. Continue to monitor, if episodes recur, proceed with brain imaging as previously planned and repeat EEG. The headaches have quieted down, continue to monitor. [place] driving laws were discussed, she is aware to stop driving after an episode of loss of consciousness until 6 months event-free. Follow-up in 6 months, she knows to call for any changes.    Follow Up Instructions:   -I discussed the assessment and treatment plan with the patient. The patient was provided an opportunity to ask questions and all were answered. The patient agreed with the plan and demonstrated an understanding of the instructions.   The patient was advised to call back or seek an in-person evaluation if the symptoms worsen or if the condition fails to improve as anticipated.    06/19/2019, MD

## 2019-09-04 ENCOUNTER — Encounter (HOSPITAL_COMMUNITY): Payer: Self-pay

## 2019-09-04 ENCOUNTER — Observation Stay (HOSPITAL_COMMUNITY)
Admission: EM | Admit: 2019-09-04 | Discharge: 2019-09-05 | Disposition: A | Discharge status: Home / Self Care | Payer: Managed Care, Other (non HMO) | Attending: Internal Medicine | Admitting: Internal Medicine

## 2019-09-04 ENCOUNTER — Other Ambulatory Visit: Payer: Self-pay

## 2019-09-04 ENCOUNTER — Emergency Department (HOSPITAL_COMMUNITY): Payer: Managed Care, Other (non HMO)

## 2019-09-04 DIAGNOSIS — E876 Hypokalemia: Secondary | ICD-10-CM

## 2019-09-04 DIAGNOSIS — F419 Anxiety disorder, unspecified: Secondary | ICD-10-CM | POA: Diagnosis not present

## 2019-09-04 DIAGNOSIS — R112 Nausea with vomiting, unspecified: Secondary | ICD-10-CM | POA: Diagnosis not present

## 2019-09-04 DIAGNOSIS — Z881 Allergy status to other antibiotic agents status: Secondary | ICD-10-CM | POA: Diagnosis not present

## 2019-09-04 DIAGNOSIS — J45909 Unspecified asthma, uncomplicated: Secondary | ICD-10-CM | POA: Diagnosis not present

## 2019-09-04 DIAGNOSIS — R569 Unspecified convulsions: Secondary | ICD-10-CM

## 2019-09-04 DIAGNOSIS — Z79899 Other long term (current) drug therapy: Secondary | ICD-10-CM | POA: Diagnosis not present

## 2019-09-04 DIAGNOSIS — Z20822 Contact with and (suspected) exposure to covid-19: Secondary | ICD-10-CM | POA: Diagnosis not present

## 2019-09-04 DIAGNOSIS — R109 Unspecified abdominal pain: Secondary | ICD-10-CM | POA: Insufficient documentation

## 2019-09-04 DIAGNOSIS — G40401 Other generalized epilepsy and epileptic syndromes, not intractable, with status epilepticus: Principal | ICD-10-CM | POA: Insufficient documentation

## 2019-09-04 DIAGNOSIS — G40901 Epilepsy, unspecified, not intractable, with status epilepticus: Secondary | ICD-10-CM

## 2019-09-04 DIAGNOSIS — R197 Diarrhea, unspecified: Secondary | ICD-10-CM | POA: Diagnosis not present

## 2019-09-04 LAB — CBC WITH DIFFERENTIAL/PLATELET
Abs Immature Granulocytes: 0.01 10*3/uL (ref 0.00–0.07)
Basophils Absolute: 0 10*3/uL (ref 0.0–0.1)
Basophils Relative: 0 %
Eosinophils Absolute: 0.1 10*3/uL (ref 0.0–0.5)
Eosinophils Relative: 2 %
HCT: 38.4 % (ref 36.0–46.0)
Hemoglobin: 13.1 g/dL (ref 12.0–15.0)
Immature Granulocytes: 0 %
Lymphocytes Relative: 40 %
Lymphs Abs: 3 10*3/uL (ref 0.7–4.0)
MCH: 31.1 pg (ref 26.0–34.0)
MCHC: 34.1 g/dL (ref 30.0–36.0)
MCV: 91.2 fL (ref 80.0–100.0)
Monocytes Absolute: 0.5 10*3/uL (ref 0.1–1.0)
Monocytes Relative: 6 %
Neutro Abs: 3.9 10*3/uL (ref 1.7–7.7)
Neutrophils Relative %: 52 %
Platelets: 272 10*3/uL (ref 150–400)
RBC: 4.21 MIL/uL (ref 3.87–5.11)
RDW: 12.8 % (ref 11.5–15.5)
WBC: 7.5 10*3/uL (ref 4.0–10.5)
nRBC: 0 % (ref 0.0–0.2)

## 2019-09-04 MED ORDER — LEVETIRACETAM IN NACL 1000 MG/100ML IV SOLN
1000.0000 mg | Freq: Once | INTRAVENOUS | Status: AC
  Administered 2019-09-04: 1000 mg via INTRAVENOUS
  Filled 2019-09-04: qty 100

## 2019-09-04 NOTE — ED Triage Notes (Signed)
Pt arrives via EMS for seizures. Hx of seizures. Pt had 4 witnessed seizures back to back for approx 30 secs-1 minute. Post-ictal state for approx 5 minutes per family member.  Denies hitting head or biting tongue/lip. 20G L AC. Endorses 2 weeks of abd pain/N/V and diarrhea.   BP: 132/74, HR76, 99%RA CBG96

## 2019-09-04 NOTE — ED Notes (Signed)
Pt taken to CT via stretcher in stable condition with face mask in place. 

## 2019-09-05 ENCOUNTER — Encounter (HOSPITAL_COMMUNITY): Payer: Self-pay | Admitting: Emergency Medicine

## 2019-09-05 DIAGNOSIS — E876 Hypokalemia: Secondary | ICD-10-CM | POA: Diagnosis not present

## 2019-09-05 DIAGNOSIS — R569 Unspecified convulsions: Secondary | ICD-10-CM

## 2019-09-05 DIAGNOSIS — G40901 Epilepsy, unspecified, not intractable, with status epilepticus: Secondary | ICD-10-CM

## 2019-09-05 LAB — RAPID URINE DRUG SCREEN, HOSP PERFORMED
Amphetamines: NOT DETECTED
Barbiturates: NOT DETECTED
Benzodiazepines: NOT DETECTED
Cocaine: NOT DETECTED
Opiates: NOT DETECTED
Tetrahydrocannabinol: POSITIVE — AB

## 2019-09-05 LAB — I-STAT CHEM 8, ED
BUN: 9 mg/dL (ref 6–20)
Calcium, Ion: 1.12 mmol/L — ABNORMAL LOW (ref 1.15–1.40)
Chloride: 106 mmol/L (ref 98–111)
Creatinine, Ser: 0.8 mg/dL (ref 0.44–1.00)
Glucose, Bld: 105 mg/dL — ABNORMAL HIGH (ref 70–99)
HCT: 36 % (ref 36.0–46.0)
Hemoglobin: 12.2 g/dL (ref 12.0–15.0)
Potassium: 2.9 mmol/L — ABNORMAL LOW (ref 3.5–5.1)
Sodium: 137 mmol/L (ref 135–145)
TCO2: 22 mmol/L (ref 22–32)

## 2019-09-05 LAB — SARS CORONAVIRUS 2 BY RT PCR (HOSPITAL ORDER, PERFORMED IN ~~LOC~~ HOSPITAL LAB): SARS Coronavirus 2: NEGATIVE

## 2019-09-05 LAB — I-STAT BETA HCG BLOOD, ED (MC, WL, AP ONLY): I-stat hCG, quantitative: <5 m[IU]/mL (ref <5)

## 2019-09-05 MED ORDER — LEVETIRACETAM 500 MG PO TABS: 500.0000 mg | ORAL_TABLET | Freq: Two times a day (BID) | ORAL | Status: DC

## 2019-09-05 MED ORDER — MAGNESIUM SULFATE 2 GM/50ML IV SOLN
2.0000 g | Freq: Once | INTRAVENOUS | Status: AC
  Administered 2019-09-05: 2 g via INTRAVENOUS
  Filled 2019-09-05: qty 50

## 2019-09-05 MED ORDER — POTASSIUM CHLORIDE 10 MEQ/100ML IV SOLN
10.0000 meq | INTRAVENOUS | Status: DC
  Administered 2019-09-05 (×2): 10 meq via INTRAVENOUS
  Filled 2019-09-05 (×2): qty 100

## 2019-09-05 NOTE — ED Notes (Signed)
This RN informed pt of ready bed upstairs, pt requesting to go home/ leave AMA. This rn explained to pt importance of admission for further testing, safety reasons etc. Pt requesting time to think about admission. Will check  Back on pt.

## 2019-09-05 NOTE — ED Notes (Signed)
Pt requesting to leave AMA. Will notify admitting MD

## 2019-09-05 NOTE — Consult Note (Addendum)
NEURO HOSPITALIST CONSULT NOTE   Requestig physician: Dr. Nicanor Alcon  Reason for Consult: Recurrence of seizures after being off anticonvulsant medication since 2016  History obtained from:  Patient and Chart     HPI:                                                                                                                                          Theresa Marshall is an 24 y.o. female presenting with recurrence of seizures after being off of anticonvulsant medication since 2016. She had the first seizure of her lifetime at age 89. She states that her seizure evaluation included an MRI brain that was negative. She was diagnosed with epilepsy and started on Keppra. She states that her 1st two Neurologists were at [facility], that the second Neurologist discontinued the Keppra in 2016 and that she now sees Dr. Karel Jarvis at [facility].    She presented to the ED on Wednesday via EMS after having 4 back to back seizures at home, which were witnessed by her significant other. He states that the first seizure lasted for 2-3 minutes. She regained consciousness and was postictal for about 5 minutes, then had a second seizure lasting for about 5 minutes. She then had 2 more seizures, each lasting about 5 minutes with a short period of regained consciousness between them. The last seizure occurred while EMS was evaluating her. She states that she bit her tongue on interview with the Neurologist, but denied this to the Triage nurse. She denies any B/B incontinence. She endorses having 2 weeks of abdominal pain with N/V and diarrhea. She was hypokalemic on initial ED labs.   She denies any precipitating events, including no cessation of any meds, not starting any new meds and no EtOH withdrawal.   On arrival, BP was 132/74, HR 76, 99% RA, with CBG of 96.  CT head obtained in the ED is normal.   Past Medical History:  Diagnosis Date  . Anemia   . Anxiety   . Asthma    exercise induced   . Infection    UTI, yeast  . Ovarian cyst   . Pregnancy   . Seizures (HCC)    last seizure 04/21/2019  . Vaginosis     Past Surgical History:  Procedure Laterality Date  . APPENDECTOMY  2008    Family History  Problem Relation Age of Onset  . Healthy Mother   . Healthy Father               Social History:  reports that she has never smoked. She has never used smokeless tobacco. She reports current drug use. Frequency: 7.00 times per week. Drug: Marijuana. She reports that she does not drink alcohol.  Allergies  Allergen Reactions  .  Metronidazole Other (See Comments)    Rash, had seizure after taking- saw neurologist, felt not related,  Seizures continued after stopping medication  . Pecan Nut (Diagnostic) Itching and Swelling    Patient is also allergic to walnuts.    HOME MEDICATIONS:                                                                                                                      No current facility-administered medications on file prior to encounter.   Current Outpatient Medications on File Prior to Encounter  Medication Sig Dispense Refill  . acetaminophen (TYLENOL) 325 MG tablet Take 2 tablets (650 mg total) by mouth every 4 (four) hours as needed (for pain scale < 4). (Patient taking differently: Take 650 mg by mouth 3 (three) times daily as needed for mild pain or headache. ) 30 tablet 0  . acyclovir cream (ZOVIRAX) 5 % Apply 1 application topically 3 (three) times daily as needed (Rash).    Marland Kitchen aspirin-acetaminophen-caffeine (EXCEDRIN MIGRAINE) 250-250-65 MG tablet Take 2 tablets by mouth 3 (three) times daily as needed for headache.    . cetirizine (ZYRTEC) 10 MG tablet Take 10 mg by mouth daily as needed for allergies.    . ferrous sulfate (FERROUSUL) 325 (65 FE) MG tablet Take 1 tablet (325 mg total) by mouth daily with breakfast. 30 tablet 11  . ibuprofen (ADVIL) 800 MG tablet Take 800 mg by mouth daily as needed for moderate pain.    . Prenatal  Vit-Fe Fumarate-FA (MULTIVITAMIN-PRENATAL) 27-0.8 MG TABS tablet Take 1 tablet by mouth daily at 12 noon.    . sertraline (ZOLOFT) 50 MG tablet Take 50 mg by mouth daily.    . VENTOLIN HFA 108 (90 Base) MCG/ACT inhaler Inhale 2 puffs into the lungs every 6 (six) hours as needed for wheezing. 6.7 g 1  . XULANE 150-35 MCG/24HR transdermal patch Place 1 patch onto the skin once a week.    . butalbital-acetaminophen-caffeine (FIORICET) 50-325-40 MG tablet Take 1 tablet as needed for severe headache. Do not take more than 2-3 a week (Patient not taking: Reported on 09/05/2019) 10 tablet 5  . docusate sodium (COLACE) 100 MG capsule Take 1 capsule (100 mg total) by mouth 2 (two) times daily. (Patient not taking: Reported on 09/05/2019) 10 capsule 0  . ibuprofen (ADVIL) 600 MG tablet Take 1 tablet (600 mg total) by mouth every 6 (six) hours. (Patient not taking: Reported on 09/05/2019) 30 tablet 0  . oxyCODONE-acetaminophen (PERCOCET) 5-325 MG tablet Take 1 tablet by mouth every 6 (six) hours as needed for severe pain. (Patient not taking: Reported on 09/05/2019) 5 tablet 0     ROS:  As per HPI. Does not endorse any additional symptoms.    Blood pressure 119/73, pulse 78, resp. rate 14, height 5\' 6"  (1.676 m), SpO2 99 %, unknown if currently breastfeeding.   General Examination:                                                                                                       Physical Exam  HEENT-  [place]/AT  Lungs- Respirations unlabored Extremities- No edema  Neurological Examination Mental Status:  Alert, fully oriented, thought content appropriate.  Speech fluent without evidence of aphasia.  Able to follow all commands without difficulty. Cranial Nerves: II: PERRL. Visual fields intact with no extinction to DSS.  III,IV, VI: EOMI. No nystagmus. No ptosis.  V,VII:  Smile symmetric, facial temp sensation equal bilaterally VIII: Hearing intact to conversation IX,X: Palate rises symmetrically XI: Symmetric shoulder shrug XII: Midline tongue extension Motor: Right : Upper extremity   5/5    Left:     Upper extremity   5/5  Lower extremity   5/5     Lower extremity   5/5 No pronator drift Sensory: Temp and light touch intact throughout, bilaterally Deep Tendon Reflexes: 2+ bilateral brachioradialis and biceps. 3+ bilateral patellae. 2+ bilaterall achilles. Toes downgoing bilaterally.  Cerebellar: No ataxia with FNF bilaterally  Gait: Deferred   Lab Results: Basic Metabolic Panel: Recent Labs  Lab 09/04/19 2359  NA 137  K 2.9*  CL 106  GLUCOSE 105*  BUN 9  CREATININE 0.80    CBC: Recent Labs  Lab 09/04/19 2332 09/04/19 2359  WBC 7.5  --   NEUTROABS 3.9  --   HGB 13.1 12.2  HCT 38.4 36.0  MCV 91.2  --   PLT 272  --     Cardiac Enzymes: No results for input(s): CKTOTAL, CKMB, CKMBINDEX, TROPONINI in the last 168 hours.  Lipid Panel: No results for input(s): CHOL, TRIG, HDL, CHOLHDL, VLDL, LDLCALC in the last 168 hours.  Imaging: CT Head Wo Contrast  Result Date: 09/04/2019 CLINICAL DATA:  Multiple seizure EXAM: CT HEAD WITHOUT CONTRAST TECHNIQUE: Contiguous axial images were obtained from the base of the skull through the vertex without intravenous contrast. COMPARISON:  MRI 08/06/2013 FINDINGS: Brain: No evidence of acute infarction, hemorrhage, hydrocephalus, extra-axial collection or mass lesion/mass effect. Vascular: No hyperdense vessel or unexpected calcification. Skull: Normal. Negative for fracture or focal lesion. Sinuses/Orbits: Mucosal thickening in the ethmoid and maxillary sinuses Other: None IMPRESSION: Negative non contrasted CT appearance of the brain Electronically Signed   By: 08/08/2013 M.D.   On: 09/04/2019 23:59    Assessment: 24 year old female presenting with recurrence of seizures after being off of her  anticonvulsant regimen since 2016.  1. Exam is normal.  2. CT head is normal.  3. Hypokalemia.  4. Na normal. Ionized Ca slightly low.    Recommendations: 1. EEG in AM (ordered) 2. Agree with loading Keppra 1000 mg x 1. Continue Keppra at scheduled dose of 500 mg PO BID (ordered) 3. Per [facility] statutes, patients with seizures are  not allowed to drive until  they have been seizure-free for six months. Use caution when using heavy equipment or power tools. Avoid working on ladders or at heights. Take showers instead of baths. Ensure the water temperature is not too high on the home water heater. Do not go swimming alone. When caring for infants or small children, sit down when holding, feeding, or changing them to minimize risk of injury to the child in the event you have a seizure. Also, Maintain good sleep hygiene. Avoid alcohol. Seizure precautions have been discussed with the patient and her significant other. They expressed understanding and agreement with the plan.  4. Consider possible Cannabinoid Hyperemesis Syndrome as the etiology for the vomiting in this patient.  5. Outpatient Neurology follow up.    Electronically signed: Dr. Caryl Pina 09/05/2019, 12:29 AM

## 2019-09-05 NOTE — ED Provider Notes (Signed)
[facility] EMERGENCY DEPARTMENT Provider Note   CSN: [id number] Arrival date & time: 09/04/19  2327     History Chief Complaint  Patient presents with  . Seizures    Theresa Marshall is a 24 y.o. female.  The history is provided by the patient.  Seizures Seizure activity on arrival: no   Seizure type:  Grand mal Preceding symptoms: no sensation of an aura present   Initial focality:  None Episode characteristics: generalized shaking   Postictal symptoms: somnolence   Return to baseline: yes   Severity:  Moderate Timing:  Clustered Number of seizures this episode:  4 Progression:  Unchanged Context: not alcohol withdrawal   Recent head injury:  No recent head injuries PTA treatment:  None History of seizures: yes   Severity:  Moderate Seizure control level:  Uncontrolled Current therapy:  None Patient with seizure history off medication as she was felt to be well controlled now with 4 seizures in 1.5 hours following having nausea and vomiting and diarrhea.       Past Medical History:  Diagnosis Date  . Anemia   . Anxiety   . Asthma    exercise induced  . Infection    UTI, yeast  . Ovarian cyst   . Pregnancy   . Seizures (HCC)    last seizure 04/21/2019  . Vaginosis     Patient Active Problem List   Diagnosis Date Noted  . Vaginal delivery 04/26/2019  . Full-term premature rupture of membranes (PROM) with unknown onset of labor 04/25/2019  . False labor after 37 completed weeks of gestation 04/17/2019  . Seizures (HCC) 05/24/2013    Past Surgical History:  Procedure Laterality Date  . APPENDECTOMY  2008     OB History    Gravida  1   Para  1   Term  1   Preterm      AB      Living  1     SAB      TAB      Ectopic      Multiple  0   Live Births  1           Family History  Problem Relation Age of Onset  . Healthy Mother   . Healthy Father     Social History   Tobacco Use  . Smoking status: Never  Smoker  . Smokeless tobacco: Never Used  Substance Use Topics  . Alcohol use: Never  . Drug use: Yes    Frequency: 7.0 times per week    Types: Marijuana    Home Medications Prior to Admission medications   Medication Sig Start Date End Date Taking? Authorizing Provider  acetaminophen (TYLENOL) 325 MG tablet Take 2 tablets (650 mg total) by mouth every 4 (four) hours as needed (for pain scale < 4). 04/27/19   Taam-Akelman, Griselda Miner, MD  butalbital-acetaminophen-caffeine (FIORICET) 50-325-40 MG tablet Take 1 tablet as needed for severe headache. Do not take more than 2-3 a week 12/21/18   Van Clines, MD  docusate sodium (COLACE) 100 MG capsule Take 1 capsule (100 mg total) by mouth 2 (two) times daily. 04/27/19   Taam-Akelman, Griselda Miner, MD  ferrous sulfate (FERROUSUL) 325 (65 FE) MG tablet Take 1 tablet (325 mg total) by mouth daily with breakfast. 04/27/19   Taam-Akelman, Griselda Miner, MD  ibuprofen (ADVIL) 600 MG tablet Take 1 tablet (600 mg total) by mouth every 6 (six) hours. 04/27/19  Rande Brunt, MD  oxyCODONE-acetaminophen (PERCOCET) 5-325 MG tablet Take 1 tablet by mouth every 6 (six) hours as needed for severe pain. 04/27/19   Taam-Akelman, Griselda Miner, MD  Prenatal Vit-Fe Fumarate-FA (MULTIVITAMIN-PRENATAL) 27-0.8 MG TABS tablet Take 1 tablet by mouth daily at 12 noon.    [provider]  VENTOLIN HFA 108 (90 Base) MCG/ACT inhaler Inhale 2 puffs into the lungs every 6 (six) hours as needed for wheezing. 11/28/18   Raelyn Mora, CNM    Allergies    Metronidazole and Pecan nut (diagnostic)  Review of Systems   Review of Systems  Constitutional: Negative for fever.  HENT: Negative for congestion.   Eyes: Negative for visual disturbance.  Respiratory: Negative for shortness of breath.   Cardiovascular: Negative for chest pain.  Gastrointestinal: Positive for diarrhea, nausea and vomiting.  Genitourinary: Negative for dysuria.  Musculoskeletal: Negative for  arthralgias.  Skin: Negative for color change.  Neurological: Positive for seizures.  Psychiatric/Behavioral: Negative for agitation and confusion.  All other systems reviewed and are negative.   Physical Exam Updated Vital Signs BP 119/73   Pulse 78   Resp 14   Ht 5\' 6"  (1.676 m)   SpO2 99%   BMI 27.92 kg/m   Physical Exam Vitals reviewed.  Constitutional:      General: She is not in acute distress.    Appearance: Normal appearance.  HENT:     Head: Normocephalic and atraumatic.     Nose: Nose normal.  Eyes:     Extraocular Movements: Extraocular movements intact.     Conjunctiva/sclera: Conjunctivae normal.     Pupils: Pupils are equal, round, and reactive to light.  Cardiovascular:     Rate and Rhythm: Normal rate and regular rhythm.     Pulses: Normal pulses.     Heart sounds: Normal heart sounds.  Pulmonary:     Effort: Pulmonary effort is normal.     Breath sounds: Normal breath sounds.  Abdominal:     General: Abdomen is flat. Bowel sounds are normal.     Tenderness: There is no abdominal tenderness. There is no guarding.  Musculoskeletal:        General: Normal range of motion.     Cervical back: Normal range of motion and neck supple.  Skin:    General: Skin is warm and dry.     Capillary Refill: Capillary refill takes less than 2 seconds.  Neurological:     General: No focal deficit present.     Mental Status: She is alert and oriented to person, place, and time.     Deep Tendon Reflexes: Reflexes normal.  Psychiatric:        Mood and Affect: Mood normal.        Behavior: Behavior normal.     ED Results / Procedures / Treatments   Labs (all labs ordered are listed, but only abnormal results are displayed) Results for orders placed or performed during the hospital encounter of 09/04/19  CBC with Differential/Platelet  Result Value Ref Range   WBC 7.5 4.0 - 10.5 K/uL   RBC 4.21 3.87 - 5.11 MIL/uL   Hemoglobin 13.1 12.0 - 15.0 g/dL   HCT 09/06/19  44.3 - 15.4 %   MCV 91.2 80.0 - 100.0 fL   MCH 31.1 26.0 - 34.0 pg   MCHC 34.1 30.0 - 36.0 g/dL   RDW 00.8 67.6 - 19.5 %   Platelets 272 150 - 400 K/uL   nRBC 0.0 0.0 -  0.2 %   Neutrophils Relative % 52 %   Neutro Abs 3.9 1.7 - 7.7 K/uL   Lymphocytes Relative 40 %   Lymphs Abs 3.0 0.7 - 4.0 K/uL   Monocytes Relative 6 %   Monocytes Absolute 0.5 0.1 - 1.0 K/uL   Eosinophils Relative 2 %   Eosinophils Absolute 0.1 0.0 - 0.5 K/uL   Basophils Relative 0 %   Basophils Absolute 0.0 0.0 - 0.1 K/uL   Immature Granulocytes 0 %   Abs Immature Granulocytes 0.01 0.00 - 0.07 K/uL  I-stat chem 8, ED (not at [facility] or [facility])  Result Value Ref Range   Sodium 137 135 - 145 mmol/L   Potassium 2.9 (L) 3.5 - 5.1 mmol/L   Chloride 106 98 - 111 mmol/L   BUN 9 6 - 20 mg/dL   Creatinine, Ser 5.28 0.44 - 1.00 mg/dL   Glucose, Bld 413 (H) 70 - 99 mg/dL   Calcium, Ion 2.44 (L) 1.15 - 1.40 mmol/L   TCO2 22 22 - 32 mmol/L   Hemoglobin 12.2 12.0 - 15.0 g/dL   HCT 01.0 27.2 - 53.6 %   CT Head Wo Contrast  Result Date: 09/04/2019 CLINICAL DATA:  Multiple seizure EXAM: CT HEAD WITHOUT CONTRAST TECHNIQUE: Contiguous axial images were obtained from the base of the skull through the vertex without intravenous contrast. COMPARISON:  MRI 08/06/2013 FINDINGS: Brain: No evidence of acute infarction, hemorrhage, hydrocephalus, extra-axial collection or mass lesion/mass effect. Vascular: No hyperdense vessel or unexpected calcification. Skull: Normal. Negative for fracture or focal lesion. Sinuses/Orbits: Mucosal thickening in the ethmoid and maxillary sinuses Other: None IMPRESSION: Negative non contrasted CT appearance of the brain Electronically Signed   By: Jasmine Pang M.D.   On: 09/04/2019 23:59    EKG  EKG Interpretation  Date/Time:  Wednesday Sep 04 2019 23:36:14 EDT Ventricular Rate:  80 PR Interval:    QRS Duration: 107 QT Interval:  368 QTC Calculation: 425 R Axis:   60 Text Interpretation: Sinus rhythm  Confirmed by Terryann Verbeek ([id number]) on 09/05/2019 12:29:07 AM       Radiology CT Head Wo Contrast  Result Date: 09/04/2019 CLINICAL DATA:  Multiple seizure EXAM: CT HEAD WITHOUT CONTRAST TECHNIQUE: Contiguous axial images were obtained from the base of the skull through the vertex without intravenous contrast. COMPARISON:  MRI 08/06/2013 FINDINGS: Brain: No evidence of acute infarction, hemorrhage, hydrocephalus, extra-axial collection or mass lesion/mass effect. Vascular: No hyperdense vessel or unexpected calcification. Skull: Normal. Negative for fracture or focal lesion. Sinuses/Orbits: Mucosal thickening in the ethmoid and maxillary sinuses Other: None IMPRESSION: Negative non contrasted CT appearance of the brain Electronically Signed   By: Jasmine Pang M.D.   On: 09/04/2019 23:59    Procedures Procedures (including critical care time)  Medications Ordered in ED Medications  magnesium sulfate IVPB 2 g 50 mL (has no administration in time range)  potassium chloride 10 mEq in 100 mL IVPB (has no administration in time range)  levETIRAcetam (KEPPRA) IVPB 1000 mg/100 mL premix (0 mg Intravenous Stopped 09/05/19 0016)    ED Course  I have reviewed the triage vital signs and the nursing notes.  Pertinent labs & imaging results that were available during my care of the patient were reviewed by me and considered in my medical decision making (see chart for details).    Admit for status epilepticus.  Dr. Otelia Limes to see.   Final Clinical Impression(s) / ED Diagnoses Final diagnoses:  None    Rx /  DC Orders ED Discharge Orders    None       Crimson Beer, MD 09/05/19 [id number]

## 2019-09-12 ENCOUNTER — Telehealth: Payer: Self-pay

## 2019-09-12 DIAGNOSIS — R569 Unspecified convulsions: Secondary | ICD-10-CM

## 2019-09-12 NOTE — Telephone Encounter (Signed)
Pt called and informed that Dr Karel Jarvis received the records from the ER about the seizures. they had previously discussed that if seizures recur, we would go ahead and do an MRI brain with and without contrast and a 1-hour EEG. Pls order and have her schedule the EEG asap. She should not drive for 6 months as per [place] driving laws. Pt verbalized understanding orders placed in epic

## 2019-09-12 NOTE — Telephone Encounter (Signed)
-----   Message from Van Clines, MD sent at 09/12/2019  9:04 AM EDT ----- Regarding: pls call Pls let her know I received the records from the ER about the seizures. We had previously discussed that if seizures recur, we would go ahead and do an MRI brain with and without contrast and a 1-hour EEG. Pls order and have her schedule the EEG asap. She should not drive for 6 months as per [place] driving laws. Thanks

## 2019-09-25 ENCOUNTER — Ambulatory Visit
Admission: RE | Admit: 2019-09-25 | Discharge: 2019-09-25 | Disposition: A | Discharge status: Home / Self Care | Payer: Managed Care, Other (non HMO) | Source: Ambulatory Visit | Attending: Neurology | Admitting: Neurology

## 2019-09-25 ENCOUNTER — Other Ambulatory Visit: Payer: Self-pay

## 2019-09-25 DIAGNOSIS — R569 Unspecified convulsions: Secondary | ICD-10-CM

## 2019-09-25 MED ORDER — GADOBENATE DIMEGLUMINE 529 MG/ML IV SOLN
13.0000 mL | Freq: Once | INTRAVENOUS | Status: AC | PRN
  Administered 2019-09-25: 13 mL via INTRAVENOUS

## 2019-09-27 ENCOUNTER — Telehealth: Payer: Self-pay

## 2019-09-27 NOTE — Telephone Encounter (Signed)
Pt called and given her MRI results and transferred to the front to get scheduled for her 1 hour eeg

## 2019-09-27 NOTE — Telephone Encounter (Signed)
-----   Message from Van Clines, MD sent at 09/27/2019  8:59 AM EDT ----- Pls let her know the MRI brain looks good, no evidence of tumor, stroke, or bleed. She needs to have the 1-hour EEG, I don't see it scheduled yet, pls have her schedule, thanks

## 2019-09-30 ENCOUNTER — Other Ambulatory Visit: Payer: Managed Care, Other (non HMO)

## 2019-10-09 ENCOUNTER — Telehealth: Payer: Self-pay | Admitting: *Deleted

## 2019-10-09 NOTE — Telephone Encounter (Signed)
Called to reschedule her EEG appointment that she no-showed 09/30/2019. Recording stated "I'm sorry your call cannot be completed at this time". Will try again next week.

## 2019-10-17 ENCOUNTER — Other Ambulatory Visit: Payer: Managed Care, Other (non HMO)

## 2019-12-09 LAB — RPR: RPR Ser Ql: NONREACTIVE — AB

## 2020-02-21 ENCOUNTER — Encounter (HOSPITAL_COMMUNITY): Payer: Self-pay | Admitting: Obstetrics and Gynecology

## 2020-02-21 ENCOUNTER — Inpatient Hospital Stay (HOSPITAL_COMMUNITY)
Admission: AD | Admit: 2020-02-21 | Discharge: 2020-02-21 | Disposition: A | Discharge status: Home / Self Care | Payer: Managed Care, Other (non HMO) | Attending: Obstetrics and Gynecology | Admitting: Obstetrics and Gynecology

## 2020-02-21 ENCOUNTER — Other Ambulatory Visit: Payer: Self-pay

## 2020-02-21 DIAGNOSIS — Z793 Long term (current) use of hormonal contraceptives: Secondary | ICD-10-CM | POA: Diagnosis not present

## 2020-02-21 DIAGNOSIS — N939 Abnormal uterine and vaginal bleeding, unspecified: Secondary | ICD-10-CM | POA: Diagnosis not present

## 2020-02-21 DIAGNOSIS — R109 Unspecified abdominal pain: Secondary | ICD-10-CM | POA: Insufficient documentation

## 2020-02-21 LAB — CBC
HCT: 38.5 % (ref 36.0–46.0)
Hemoglobin: 13.3 g/dL (ref 12.0–15.0)
MCH: 31 pg (ref 26.0–34.0)
MCHC: 34.5 g/dL (ref 30.0–36.0)
MCV: 89.7 fL (ref 80.0–100.0)
Platelets: 226 10*3/uL (ref 150–400)
RBC: 4.29 MIL/uL (ref 3.87–5.11)
RDW: 11.8 % (ref 11.5–15.5)
WBC: 5.6 10*3/uL (ref 4.0–10.5)
nRBC: 0 % (ref 0.0–0.2)

## 2020-02-21 LAB — URINALYSIS, ROUTINE W REFLEX MICROSCOPIC
Bacteria, UA: NONE SEEN
Bilirubin Urine: NEGATIVE
Glucose, UA: NEGATIVE mg/dL
Ketones, ur: NEGATIVE mg/dL
Nitrite: NEGATIVE
Protein, ur: NEGATIVE mg/dL
RBC / HPF: >50 RBC/hpf — ABNORMAL HIGH (ref 0–5)
Specific Gravity, Urine: 1.033 — ABNORMAL HIGH (ref 1.005–1.030)
pH: 6 (ref 5.0–8.0)

## 2020-02-21 LAB — POCT PREGNANCY, URINE: Preg Test, Ur: NEGATIVE

## 2020-02-21 LAB — HCG, QUANTITATIVE, PREGNANCY: hCG, Beta Chain, Quant, S: <1 m[IU]/mL (ref <5)

## 2020-02-21 MED ORDER — IBUPROFEN 800 MG PO TABS
800.0000 mg | ORAL_TABLET | Freq: Once | ORAL | Status: AC
  Administered 2020-02-21: 800 mg via ORAL
  Filled 2020-02-21: qty 1

## 2020-02-21 MED ORDER — IBUPROFEN 800 MG PO TABS: 800.0000 mg | ORAL_TABLET | Freq: Three times a day (TID) | ORAL | 0 refills | Status: AC | PRN

## 2020-02-21 NOTE — MAU Provider Note (Signed)
History     CSN: [id number]  Arrival date and time: 02/21/20 2038   First Provider Initiated Contact with Patient 02/21/20 2110      Chief Complaint  Patient presents with   Abdominal Pain   Vaginal Bleeding   Theresa Marshall is a 24 y.o. G1P1001 who presents today with bleeding and cramping. She states that she had a +UPT at home 3 days ago. She is using the patch for birth control. She had a baby January 2021. She has irregular bleeding even with the patch for the last few months.   Vaginal Bleeding The patient's primary symptoms include pelvic pain and vaginal bleeding. This is a new problem. The current episode started today. The problem occurs constantly. The problem has been gradually worsening. The pain is severe. She is not pregnant. Pertinent negatives include no chills, dysuria, fever, frequency, nausea or vomiting. The vaginal discharge was bloody. The vaginal bleeding is heavier than menses. She has not been passing clots. She has not been passing tissue. Nothing aggravates the symptoms. She has tried nothing for the symptoms. She uses a patch for contraception.    OB History    Gravida  1   Para  1   Term  1   Preterm      AB      Living  1     SAB      TAB      Ectopic      Multiple  0   Live Births  1           Past Medical History:  Diagnosis Date   Anemia    Anxiety    Asthma    exercise induced   Infection    UTI, yeast   Ovarian cyst    Pregnancy    Seizures (HCC)    last seizure 04/21/2019   Vaginosis     Past Surgical History:  Procedure Laterality Date   APPENDECTOMY  2008    Family History  Problem Relation Age of Onset   Healthy Mother    Healthy Father     Social History   Tobacco Use   Smoking status: Never Smoker   Smokeless tobacco: Never Used  Vaping Use   Vaping Use: Former   Devices: stopped 2018  Substance Use Topics   Alcohol use: Never   Drug use: Not Currently    Frequency: 7.0  times per week    Types: Marijuana    Comment: quite a few weeks ago    Allergies:  Allergies  Allergen Reactions   Metronidazole Other (See Comments)    Rash, had seizure after taking- saw neurologist, felt not related,  Seizures continued after stopping medication   Pecan Nut (Diagnostic) Itching and Swelling    Patient is also allergic to walnuts.    Medications Prior to Admission  Medication Sig Dispense Refill Last Dose   cetirizine (ZYRTEC) 10 MG tablet Take 10 mg by mouth daily as needed for allergies.   Past Month at Unknown time   ibuprofen (ADVIL) 800 MG tablet Take 800 mg by mouth daily as needed for moderate pain.   Past Month at Unknown time   pantoprazole (PROTONIX) 20 MG tablet Take 20 mg by mouth daily.      sertraline (ZOLOFT) 50 MG tablet Take 50 mg by mouth daily.   Past Month at Unknown time   acetaminophen (TYLENOL) 325 MG tablet Take 2 tablets (650 mg total) by mouth every 4 (  four) hours as needed (for pain scale < 4). (Patient taking differently: Take 650 mg by mouth 3 (three) times daily as needed for mild pain or headache. ) 30 tablet 0    acyclovir cream (ZOVIRAX) 5 % Apply 1 application topically 3 (three) times daily as needed (Rash).      aspirin-acetaminophen-caffeine (EXCEDRIN MIGRAINE) 250-250-65 MG tablet Take 2 tablets by mouth 3 (three) times daily as needed for headache.      butalbital-acetaminophen-caffeine (FIORICET) 50-325-40 MG tablet Take 1 tablet as needed for severe headache. Do not take more than 2-3 a week (Patient not taking: Reported on 09/05/2019) 10 tablet 5    docusate sodium (COLACE) 100 MG capsule Take 1 capsule (100 mg total) by mouth 2 (two) times daily. (Patient not taking: Reported on 09/05/2019) 10 capsule 0    ferrous sulfate (FERROUSUL) 325 (65 FE) MG tablet Take 1 tablet (325 mg total) by mouth daily with breakfast. 30 tablet 11    ibuprofen (ADVIL) 600 MG tablet Take 1 tablet (600 mg total) by mouth every 6 (six)  hours. (Patient not taking: Reported on 09/05/2019) 30 tablet 0    oxyCODONE-acetaminophen (PERCOCET) 5-325 MG tablet Take 1 tablet by mouth every 6 (six) hours as needed for severe pain. (Patient not taking: Reported on 09/05/2019) 5 tablet 0    Prenatal Vit-Fe Fumarate-FA (MULTIVITAMIN-PRENATAL) 27-0.8 MG TABS tablet Take 1 tablet by mouth daily at 12 noon.      VENTOLIN HFA 108 (90 Base) MCG/ACT inhaler Inhale 2 puffs into the lungs every 6 (six) hours as needed for wheezing. 6.7 g 1    XULANE 150-35 MCG/24HR transdermal patch Place 1 patch onto the skin once a week.       Review of Systems  Constitutional: Negative for chills and fever.  Gastrointestinal: Negative for nausea and vomiting.  Genitourinary: Positive for pelvic pain and vaginal bleeding. Negative for dysuria and frequency.   Physical Exam   Blood pressure (!) 135/95, pulse 93, temperature 97.9 F (36.6 C), temperature source Oral, resp. rate 17, height 5\' 6"  (1.676 m), weight 59.9 kg, last menstrual period 02/09/2020, SpO2 100 %, unknown if currently breastfeeding.  Physical Exam Vitals and nursing note reviewed.  Constitutional:      General: She is not in acute distress. HENT:     Head: Normocephalic.  Cardiovascular:     Rate and Rhythm: Normal rate.  Pulmonary:     Effort: Pulmonary effort is normal.  Abdominal:     Palpations: Abdomen is soft.     Tenderness: There is no abdominal tenderness.  Skin:    General: Skin is warm and dry.  Neurological:     Mental Status: She is alert and oriented to person, place, and time.  Psychiatric:        Mood and Affect: Mood normal.        Behavior: Behavior normal.     Results for orders placed or performed during the hospital encounter of 02/21/20 (from the past 24 hour(s))  Urinalysis, Routine w reflex microscopic Urine, Clean Catch     Status: Abnormal   Collection Time: 02/21/20  8:56 PM  Result Value Ref Range   Color, Urine YELLOW YELLOW   APPearance HAZY  (A) CLEAR   Specific Gravity, Urine 1.033 (H) 1.005 - 1.030   pH 6.0 5.0 - 8.0   Glucose, UA NEGATIVE NEGATIVE mg/dL   Hgb urine dipstick MODERATE (A) NEGATIVE   Bilirubin Urine NEGATIVE NEGATIVE   Ketones, ur NEGATIVE  NEGATIVE mg/dL   Protein, ur NEGATIVE NEGATIVE mg/dL   Nitrite NEGATIVE NEGATIVE   Leukocytes,Ua TRACE (A) NEGATIVE   RBC / HPF >50 (H) 0 - 5 RBC/hpf   WBC, UA 0-5 0 - 5 WBC/hpf   Bacteria, UA NONE SEEN NONE SEEN   Squamous Epithelial / LPF 6-10 0 - 5   Mucus PRESENT   Pregnancy, urine POC     Status: None   Collection Time: 02/21/20  8:57 PM  Result Value Ref Range   Preg Test, Ur NEGATIVE NEGATIVE  CBC     Status: None   Collection Time: 02/21/20  9:29 PM  Result Value Ref Range   WBC 5.6 4.0 - 10.5 K/uL   RBC 4.29 3.87 - 5.11 MIL/uL   Hemoglobin 13.3 12.0 - 15.0 g/dL   HCT 38.1 36 - 46 %   MCV 89.7 80.0 - 100.0 fL   MCH 31.0 26.0 - 34.0 pg   MCHC 34.5 30.0 - 36.0 g/dL   RDW 82.9 93.7 - 16.9 %   Platelets 226 150 - 400 K/uL   nRBC 0.0 0.0 - 0.2 %  hCG, quantitative, pregnancy     Status: None   Collection Time: 02/21/20  9:29 PM  Result Value Ref Range   hCG, Beta Chain, Quant, S <1 <5 mIU/mL     MAU Course  Procedures  MDM Patient has had ibuprofen and reports that her pain has improved.   Assessment and Plan   1. Abnormal uterine bleeding    DC home Bleeding precautions RX: ibuprofen 800mg  PRN #[id number]  FU with GYN    Follow-up Information    Ob/Gyn, Follow up.   Contact information: [address] [place] [id number]              [phone] DNP, CNM  02/21/20  10:57 PM

## 2020-02-21 NOTE — MAU Note (Signed)
Pt reports positive home preg test 3 days ago, today she began having pink spotting and then bright red bleeding and passing clots. Lower abd pain that started with the heavier bleeding. Pain is worsening.

## 2020-02-21 NOTE — Discharge Instructions (Signed)
Abnormal Uterine Bleeding °Abnormal uterine bleeding is unusual bleeding from the uterus. It includes: °· Bleeding or spotting between periods. °· Bleeding after sex. °· Bleeding that is heavier than normal. °· Periods that last longer than usual. °· Bleeding after menopause. °Abnormal uterine bleeding can affect women at various stages in life, including teenagers, women in their reproductive years, pregnant women, and women who have reached menopause. Common causes of abnormal uterine bleeding include: °· Pregnancy. °· Growths of tissue (polyps). °· A noncancerous tumor in the uterus (fibroid). °· Infection. °· Cancer. °· Hormonal imbalances. °Any type of abnormal bleeding should be evaluated by a health care provider. Many cases are minor and simple to treat, while others are more serious. Treatment will depend on the cause of the bleeding. °Follow these instructions at home: °· Monitor your condition for any changes. °· Do not use tampons, douche, or have sex if told by your health care provider. °· Change your pads often. °· Get regular exams that include pelvic exams and cervical cancer screening. °· Keep all follow-up visits as told by your health care provider. This is important. °Contact a health care provider if: °· Your bleeding lasts for more than one week. °· You feel dizzy at times. °· You feel nauseous or you vomit. °Get help right away if: °· You pass out. °· Your bleeding soaks through a pad every hour. °· You have abdominal pain. °· You have a fever. °· You become sweaty or weak. °· You pass large blood clots from your vagina. °Summary °· Abnormal uterine bleeding is unusual bleeding from the uterus. °· Any type of abnormal bleeding should be evaluated by a health care provider. Many cases are minor and simple to treat, while others are more serious. °· Treatment will depend on the cause of the bleeding. °This information is not intended to replace advice given to you by your health care provider.  Make sure you discuss any questions you have with your health care provider. °Document Revised: 07/12/2017 Document Reviewed: 05/06/2016 °Elsevier Patient Education © 2020 [employer] ° °

## 2020-04-15 LAB — HM PAP SMEAR

## 2020-07-02 ENCOUNTER — Inpatient Hospital Stay (HOSPITAL_COMMUNITY)
Admission: AD | Admit: 2020-07-02 | Discharge: 2020-07-02 | Disposition: A | Discharge status: Home / Self Care | Payer: 59 | Attending: Obstetrics and Gynecology | Admitting: Obstetrics and Gynecology

## 2020-07-02 ENCOUNTER — Telehealth: Payer: Self-pay | Admitting: Student

## 2020-07-02 ENCOUNTER — Other Ambulatory Visit: Payer: Self-pay

## 2020-07-02 DIAGNOSIS — R42 Dizziness and giddiness: Secondary | ICD-10-CM | POA: Insufficient documentation

## 2020-07-02 DIAGNOSIS — Z3201 Encounter for pregnancy test, result positive: Secondary | ICD-10-CM | POA: Diagnosis present

## 2020-07-02 DIAGNOSIS — R110 Nausea: Secondary | ICD-10-CM | POA: Diagnosis not present

## 2020-07-02 LAB — POCT PREGNANCY, URINE: Preg Test, Ur: NEGATIVE

## 2020-07-02 LAB — HCG, QUANTITATIVE, PREGNANCY: hCG, Beta Chain, Quant, S: <1 m[IU]/mL (ref <5)

## 2020-07-02 NOTE — Telephone Encounter (Signed)
Called patient and let her know that her beta hcg was less than 1. Explained that patient is probably about to start her period, due to the symptoms that she is having. Patient has appt at [facility] on 3/28; patient will keep that appt and discuss her concerns with Dr. Claudine Mouton.   Luna Kitchens

## 2020-07-02 NOTE — MAU Provider Note (Signed)
Patient Theresa Marshall is a 25 y.o.  G1P1001  at Unknown here with complaints of cramping and nausea today. She states that "its been like this for weeks but today I got lightheaded".   She denies vaginal bleeding, vaginal discharge is white and normal for her.   She had a positive pregnancy test on 06/23/2020; she took a test in February and it was negative. The test on the 8 was faintly positive. She last had her period on January 12.    UPT in MAU is negative   Patient agrees to beta HCG and will return home and wait for results.    [name] 07/02/2020, 10:44 AM

## 2020-07-02 NOTE — MAU Note (Signed)
Pt reports she missed period in Feb. Started cramping on her left side this morning. Tried to eat and she vomited.  Took HPT  Lat week and it was "faintly positive". Denies any vag bleeding reports increasedwhite creamy discharge.

## 2021-05-19 LAB — HM HEPATITIS C SCREENING LAB: HM Hepatitis Screen: NEGATIVE

## 2021-05-19 LAB — HM HIV SCREENING LAB: HM HIV Screening: NEGATIVE

## 2021-05-19 LAB — HIV ANTIBODY (ROUTINE TESTING W REFLEX): HIV 1&2 Ab, 4th Generation: NONREACTIVE

## 2022-02-15 ENCOUNTER — Other Ambulatory Visit: Payer: Self-pay

## 2022-02-15 ENCOUNTER — Emergency Department (HOSPITAL_COMMUNITY): Payer: Medicaid Other

## 2022-02-15 ENCOUNTER — Emergency Department (HOSPITAL_COMMUNITY)
Admission: EM | Admit: 2022-02-15 | Discharge: 2022-02-15 | Discharge status: Against Medical Advice | Payer: Medicaid Other | Attending: Emergency Medicine | Admitting: Emergency Medicine

## 2022-02-15 ENCOUNTER — Encounter (HOSPITAL_COMMUNITY): Payer: Self-pay

## 2022-02-15 DIAGNOSIS — Z5321 Procedure and treatment not carried out due to patient leaving prior to being seen by health care provider: Secondary | ICD-10-CM | POA: Diagnosis not present

## 2022-02-15 DIAGNOSIS — R1032 Left lower quadrant pain: Secondary | ICD-10-CM | POA: Diagnosis present

## 2022-02-15 DIAGNOSIS — N939 Abnormal uterine and vaginal bleeding, unspecified: Secondary | ICD-10-CM | POA: Diagnosis not present

## 2022-02-15 DIAGNOSIS — R102 Pelvic and perineal pain: Secondary | ICD-10-CM | POA: Insufficient documentation

## 2022-02-15 LAB — URINALYSIS, ROUTINE W REFLEX MICROSCOPIC
Bilirubin Urine: NEGATIVE
Glucose, UA: NEGATIVE mg/dL
Ketones, ur: NEGATIVE mg/dL
Leukocytes,Ua: NEGATIVE
Nitrite: NEGATIVE
Protein, ur: 30 mg/dL — AB
Specific Gravity, Urine: 1.029 (ref 1.005–1.030)
pH: 7 (ref 5.0–8.0)

## 2022-02-15 LAB — CBC
HCT: 39.5 % (ref 36.0–46.0)
Hemoglobin: 13.5 g/dL (ref 12.0–15.0)
MCH: 32.5 pg (ref 26.0–34.0)
MCHC: 34.2 g/dL (ref 30.0–36.0)
MCV: 95 fL (ref 80.0–100.0)
Platelets: 233 K/uL (ref 150–400)
RBC: 4.16 MIL/uL (ref 3.87–5.11)
RDW: 12.4 % (ref 11.5–15.5)
WBC: 5.3 K/uL (ref 4.0–10.5)
nRBC: 0 % (ref 0.0–0.2)

## 2022-02-15 LAB — COMPREHENSIVE METABOLIC PANEL
ALT: 11 U/L (ref 0–44)
AST: 16 U/L (ref 15–41)
Albumin: 4.4 g/dL (ref 3.5–5.0)
Alkaline Phosphatase: 39 U/L (ref 38–126)
Anion gap: 8 (ref 5–15)
BUN: 12 mg/dL (ref 6–20)
CO2: 23 mmol/L (ref 22–32)
Calcium: 9.2 mg/dL (ref 8.9–10.3)
Chloride: 109 mmol/L (ref 98–111)
Creatinine, Ser: 0.68 mg/dL (ref 0.44–1.00)
GFR, Estimated: >60 mL/min (ref >60)
Glucose, Bld: 95 mg/dL (ref 70–99)
Potassium: 3.6 mmol/L (ref 3.5–5.1)
Sodium: 140 mmol/L (ref 135–145)
Total Bilirubin: 1.3 mg/dL — ABNORMAL HIGH (ref 0.3–1.2)
Total Protein: 7.7 g/dL (ref 6.5–8.1)

## 2022-02-15 LAB — LIPASE, BLOOD: Lipase: 27 U/L (ref 11–51)

## 2022-02-15 LAB — I-STAT BETA HCG BLOOD, ED (MC, WL, AP ONLY): I-stat hCG, quantitative: <5 m[IU]/mL (ref <5)

## 2022-02-15 MED ORDER — ACETAMINOPHEN 500 MG PO TABS: 1000.0000 mg | ORAL_TABLET | Freq: Once | ORAL | Status: DC

## 2022-02-15 NOTE — ED Provider Triage Note (Signed)
Emergency Medicine Provider Triage Evaluation Note  Theresa Marshall , a 26 y.o. female  was evaluated in triage.  Pt complains of left lower quadrant pelvic pain severe, with one episode of emesis since last night, patient reports it feels like when she has had a cyst before although slightly more severe, she is also endorsing some vaginal bleeding, as well as clotting.  She reports her cycle is generally irregular, she is expecting a period around October 18, she reports she has low suspicion for pregnancy because she is using additional forms of contraception, she denies any fever, chills, she does report that her vaginal bleeding is with some brownish discharge.  Reports the pain radiates to her back and down her left leg.  She denies any dysuria.  Review of Systems  Positive: Pelvic pain, vaginal bleeding Negative: Fever, chills  Physical Exam  BP 124/83 (BP Location: Right Arm)   Pulse 66   Temp 98.4 F (36.9 C) (Oral)   Resp 16   SpO2 100%  Gen:   Awake, no distress   Resp:  Normal effort  MSK:   Moves extremities without difficulty  Other:  Extreme focal tenderness to palpation left lower quadrant with some guarding, no rigidity  Medical Decision Making  Medically screening exam initiated at 10:51 AM.  Appropriate orders placed.  Theresa Marshall was informed that the remainder of the evaluation will be completed by another provider, this initial triage assessment does not replace that evaluation, and the importance of remaining in the ED until their evaluation is complete.  Workup initiated   Anselmo Pickler, [place] 02/15/22 1101

## 2022-02-15 NOTE — ED Triage Notes (Signed)
Patient said she is having left lower pelvic pain. Said last time she felt like this she had an ovarian cyst.

## 2022-03-06 IMAGING — CT CT HEAD W/O CM
3 series · 16 of 47 positions shown, 19 images · non-contrast
Comparison: MRI 08/06/2013

CLINICAL DATA: Multiple seizure

EXAM:
CT HEAD WITHOUT CONTRAST
TECHNIQUE: Contiguous axial images were obtained from the base of the skull
through the vertex without intravenous contrast.

[Series 3: head 5.0 h30s · axial · 0.42mm/px · z∈[-145,-15]mm · 10 of 32 slices shown, 13 images]
[im 3/32  brain]
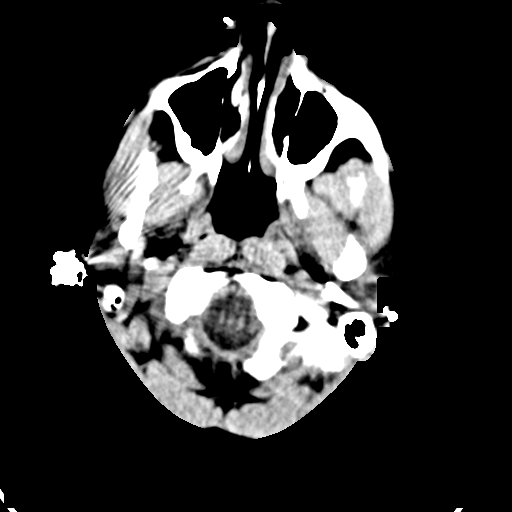
[im 3/32  bone]
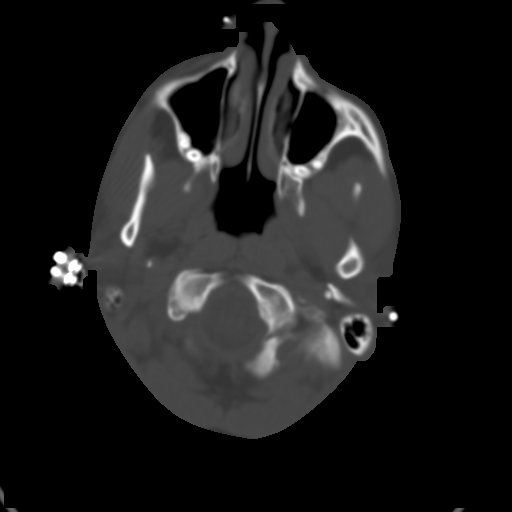
[im 6/32  brain]
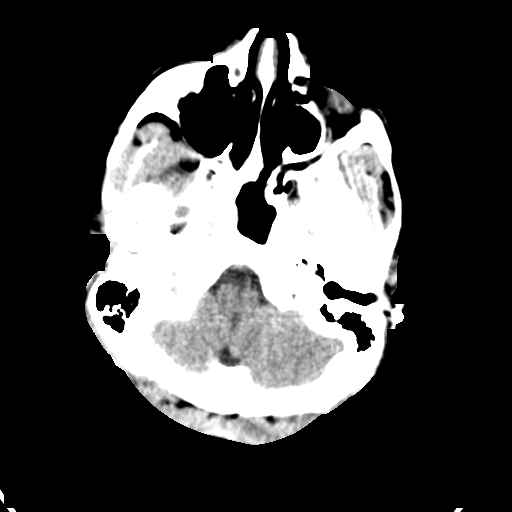
[im 9/32  brain]
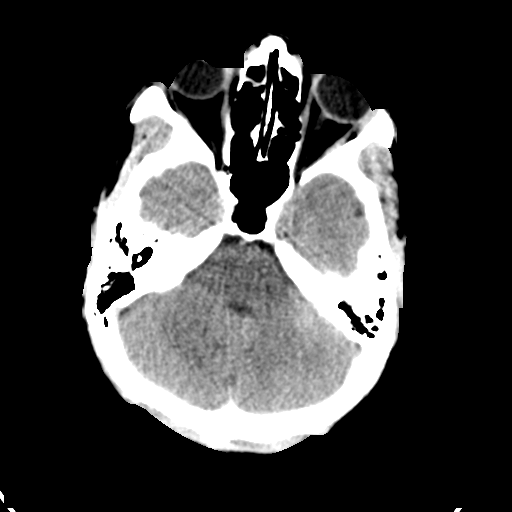
[im 11/32  brain]
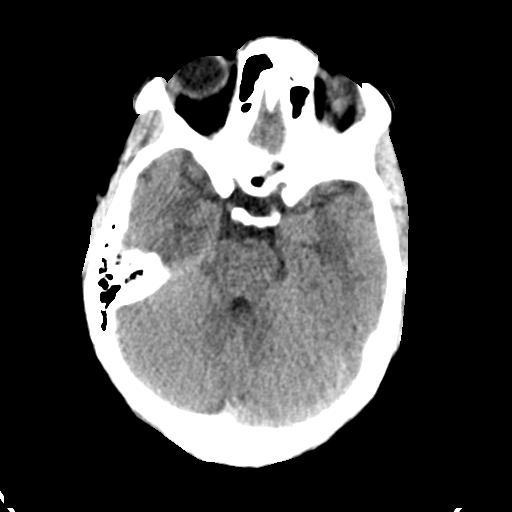
[im 14/32  brain]
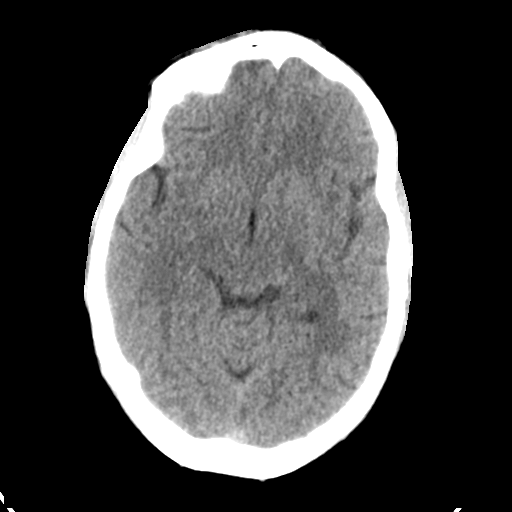
[im 14/32  bone]
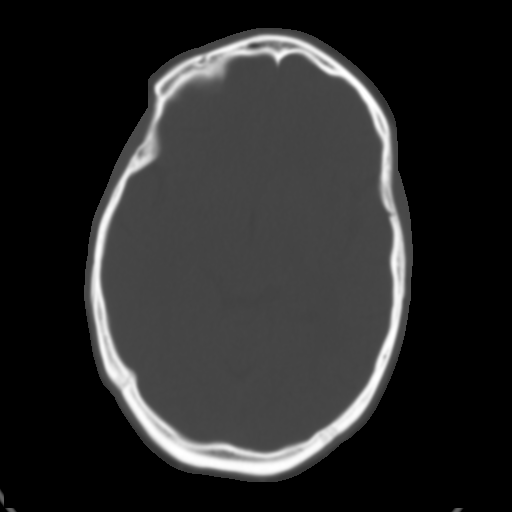
[im 18/32  brain]
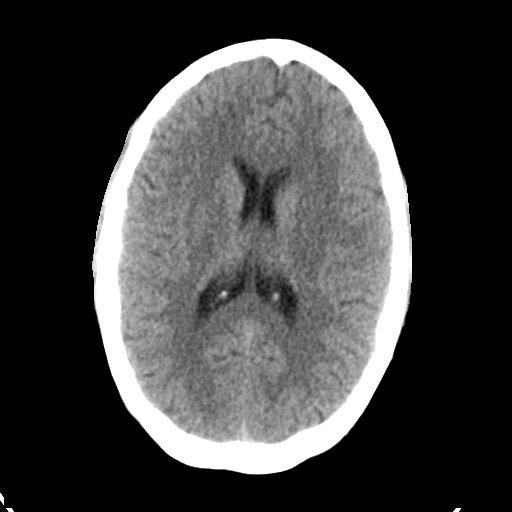
[im 21/32  brain]
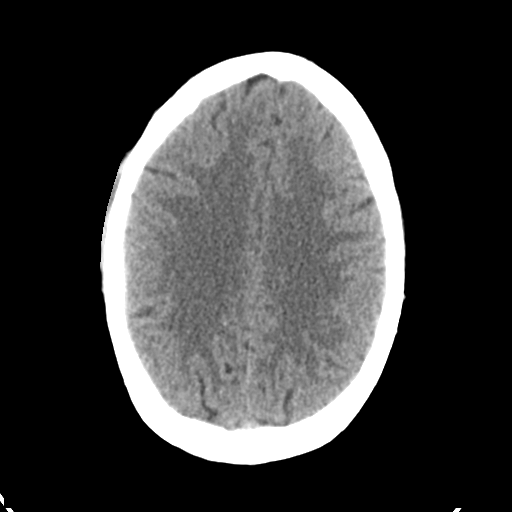
[im 24/32  brain]
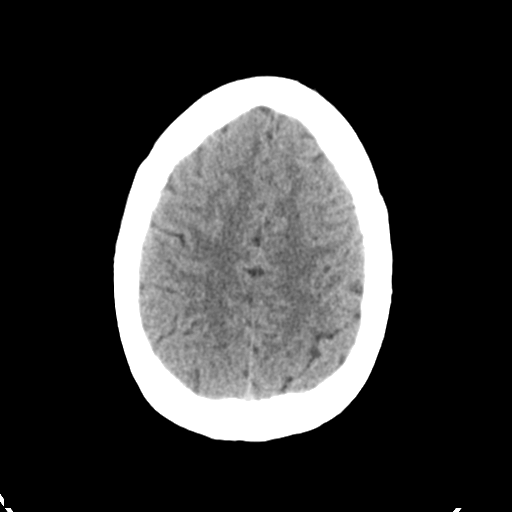
[im 26/32  brain]
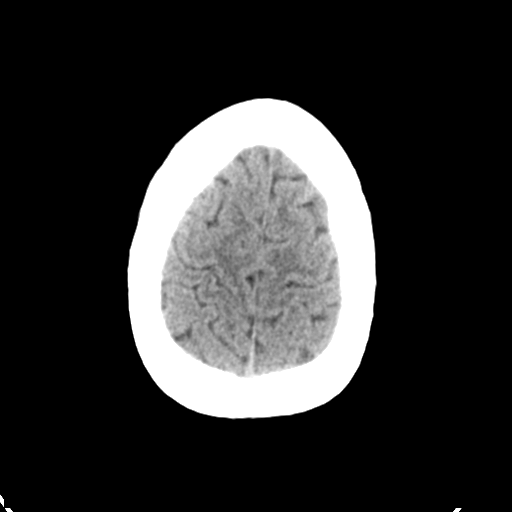
[im 26/32  bone]
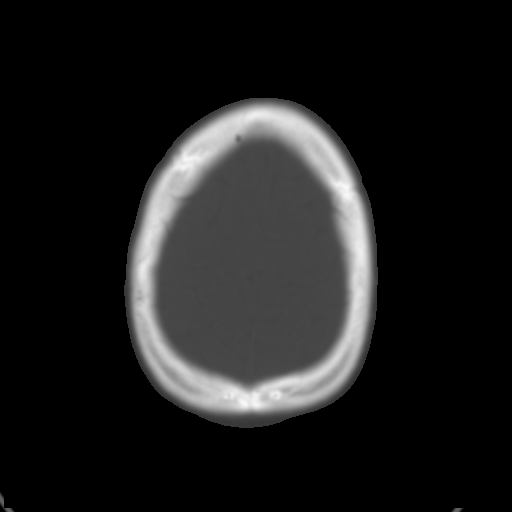
[im 29/32  brain]
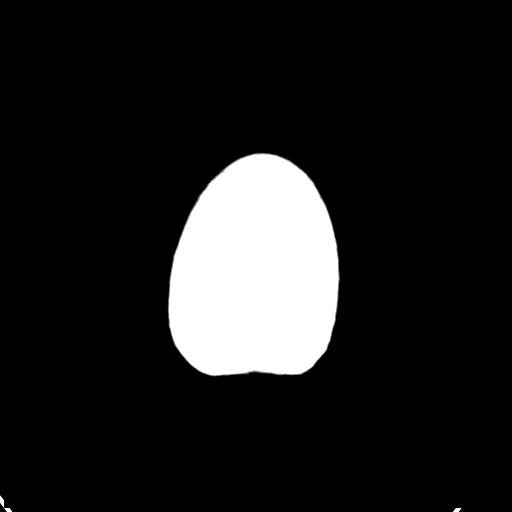

[Series 5: head 3.0 mpr cor · coronal · 0.32mm/px · 3 of 67 slices shown]
[im 23/67  brain]
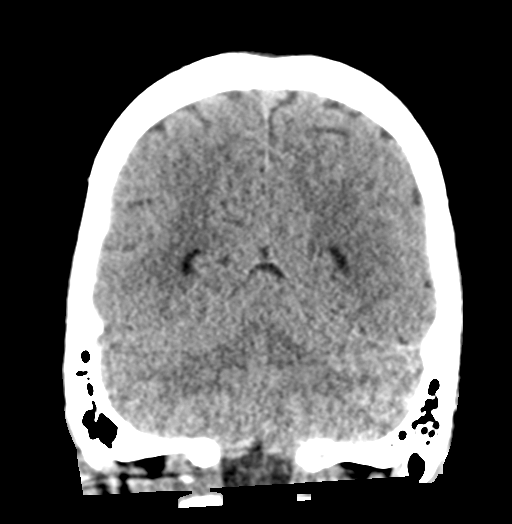
[im 30/67  brain]
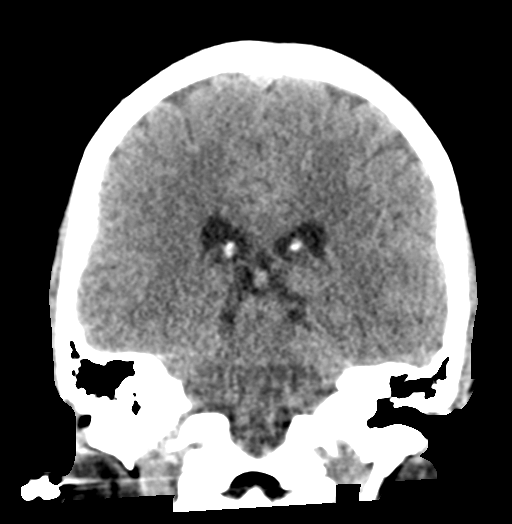
[im 37/67  brain]
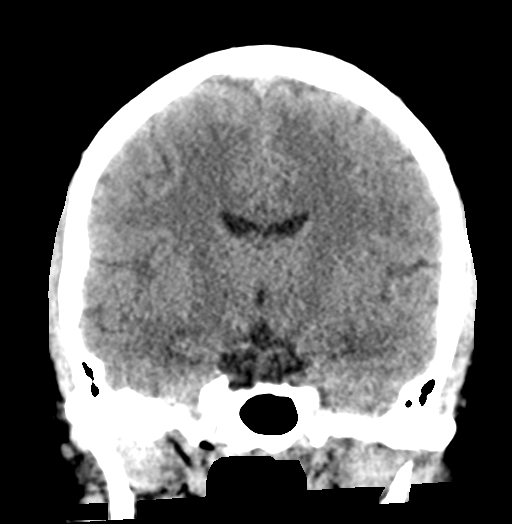

[Series 6: head 3.0 mpr sag · sagittal · 0.34mm/px · 3 of 54 slices shown]
[im 18/54  brain]
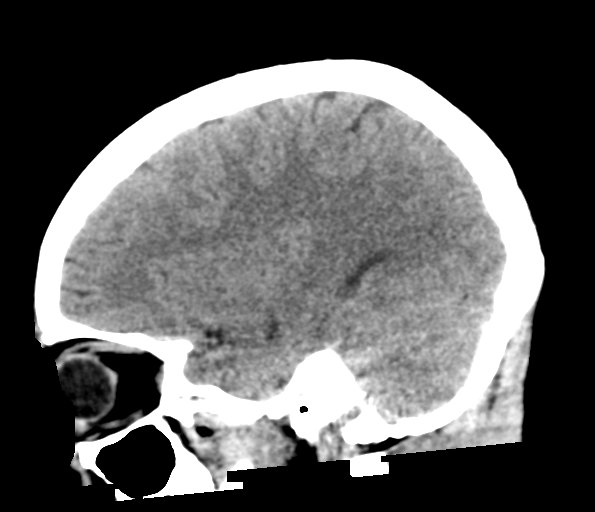
[im 27/54  brain]
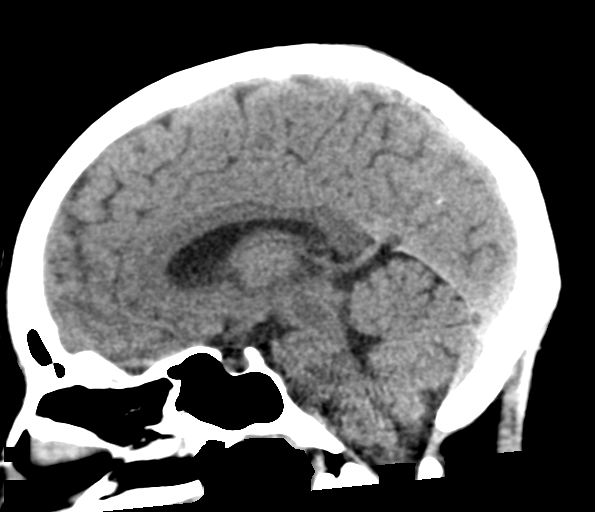
[im 36/54  brain]
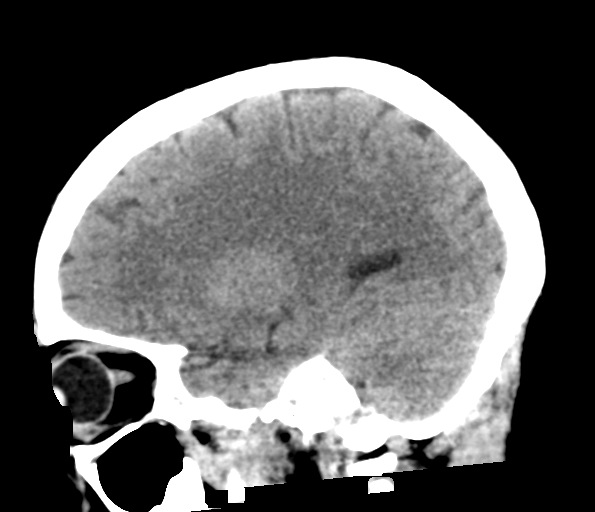

[16 of 47 positions shown; findings below may reference images not displayed]

FINDINGS: Brain: No evidence of acute infarction, hemorrhage, hydrocephalus,
extra-axial collection or mass lesion/mass effect.

Vascular: No hyperdense vessel or unexpected calcification.

Skull: Normal. Negative for fracture or focal lesion.

Sinuses/Orbits: Mucosal thickening in the ethmoid and maxillary
sinuses

Other: None
IMPRESSION: Negative non contrasted CT appearance of the brain

## 2022-03-28 ENCOUNTER — Encounter: Payer: Self-pay | Admitting: Obstetrics and Gynecology

## 2022-05-19 ENCOUNTER — Other Ambulatory Visit: Payer: Self-pay

## 2022-05-20 ENCOUNTER — Ambulatory Visit (INDEPENDENT_AMBULATORY_CARE_PROVIDER_SITE_OTHER): Payer: Medicaid Other | Admitting: Obstetrics and Gynecology

## 2022-05-20 ENCOUNTER — Other Ambulatory Visit (HOSPITAL_COMMUNITY)
Admission: RE | Admit: 2022-05-20 | Discharge: 2022-05-20 | Disposition: A | Discharge status: Home / Self Care | Payer: Medicaid Other | Source: Ambulatory Visit | Attending: Obstetrics and Gynecology | Admitting: Obstetrics and Gynecology

## 2022-05-20 ENCOUNTER — Encounter: Payer: Self-pay | Admitting: Obstetrics and Gynecology

## 2022-05-20 VITALS — BP 111/78 | HR 72 | Ht 66.0 in | Wt 120.0 lb

## 2022-05-20 DIAGNOSIS — R1032 Left lower quadrant pain: Secondary | ICD-10-CM | POA: Diagnosis not present

## 2022-05-20 DIAGNOSIS — Z01411 Encounter for gynecological examination (general) (routine) with abnormal findings: Secondary | ICD-10-CM | POA: Insufficient documentation

## 2022-05-20 NOTE — Progress Notes (Signed)
Subjective:     Theresa Marshall is a 27 y.o. female P1 witth LMP 05/05/22 and BMI 19 who is here for a comprehensive physical exam. The patient reports left lower quadrant pain that radiates to her hip and left leg. She reports irregular menses but since December she has had a monthly period lasting 4 - 7 days. She is sexually active using condoms. She has tried different forms of hormonal contraception which caused her to have mood swings or worsened her PMS symptoms. She is without any other complaints  Past Medical History:  Diagnosis Date   Anemia    Anxiety    Asthma    exercise induced   Infection    UTI, yeast   Ovarian cyst    Pregnancy    Seizures ([place])    last seizure 04/21/2019   Vaginosis    Past Surgical History:  Procedure Laterality Date   APPENDECTOMY  2008   Family History  Problem Relation Age of Onset   Healthy Mother    Healthy Father     Social History   Socioeconomic History   Marital status: Single    Spouse name: Not on file   Number of children: 1   Years of education: college   Highest education level: Not on file  Occupational History   Not on file  Tobacco Use   Smoking status: Never   Smokeless tobacco: Never  Vaping Use   Vaping Use: Former   Devices: stopped 2018  Substance and Sexual Activity   Alcohol use: Never   Drug use: Yes    Frequency: 7.0 times per week    Types: Marijuana    Comment: quite a few weeks ago   Sexual activity: Yes    Partners: Male    Birth control/protection: None  Other Topics Concern   Not on file  Social History Narrative   Patient is single, has one child   Patient is right handed   Education- some college   Caffeine consumption is 1 a week   Recent vaginal delivery 04/26/19   Social Determinants of Health   Financial Resource Strain: Not on file  Food Insecurity: Not on file  Transportation Needs: Not on file  Physical Activity: Not on file  Stress: Not on file  Social Connections: Not on file   Intimate Partner Violence: Not on file   Health Maintenance  Topic Date Due   COVID-19 Vaccine (1) Never done   DTaP/Tdap/Td (1 - Tdap) Never done   INFLUENZA VACCINE  11/16/2021   PAP-Cervical Cytology Screening  04/16/2023   PAP SMEAR-Modifier  04/16/2023   Hepatitis C Screening  Completed   HIV Screening  Completed   HPV VACCINES  Aged Out       Review of Systems Pertinent items noted in HPI and remainder of comprehensive ROS otherwise negative.   Objective:  Blood pressure 111/78, pulse 72, height 5\' 6"  (1.676 m), weight 120 lb (54.4 kg), last menstrual period 05/05/2022, unknown if currently breastfeeding.   GENERAL: Well-developed, well-nourished female in no acute distress.  HEENT: Normocephalic, atraumatic. Sclerae anicteric.  NECK: Supple. Normal thyroid.  LUNGS: Clear to auscultation bilaterally.  HEART: Regular rate and rhythm. BREASTS: Symmetric in size. No palpable masses or lymphadenopathy, skin changes, or nipple drainage. ABDOMEN: Soft, nontender, nondistended. No organomegaly. PELVIC: Normal external female genitalia. Vagina is pink and rugated.  Normal discharge. Normal appearing cervix. Uterus is normal in size. No adnexal mass or tenderness. Chaperone present during the  pelvic exam EXTREMITIES: No cyanosis, clubbing, or edema, 2+ distal pulses.     Assessment:    Healthy female exam.      Plan:    Pap smear with cultures collected Pelvic ultrasound ordered to follow up on 01/2022 visualized left ovarian cyst Patient will be contacted with abnormal results See After Visit Summary for Counseling Recommendations

## 2022-05-20 NOTE — Progress Notes (Signed)
27 y.o New GYN referral for AEX/PAP and cyst.

## 2022-05-25 LAB — CYTOLOGY - PAP
Chlamydia: NEGATIVE
Comment: NEGATIVE
Comment: NORMAL
Diagnosis: NEGATIVE
Neisseria Gonorrhea: NEGATIVE

## 2022-05-27 ENCOUNTER — Ambulatory Visit (HOSPITAL_BASED_OUTPATIENT_CLINIC_OR_DEPARTMENT_OTHER): Payer: Medicaid Other

## 2022-05-31 ENCOUNTER — Ambulatory Visit (HOSPITAL_BASED_OUTPATIENT_CLINIC_OR_DEPARTMENT_OTHER): Payer: Medicaid Other

## 2022-06-04 ENCOUNTER — Ambulatory Visit (HOSPITAL_BASED_OUTPATIENT_CLINIC_OR_DEPARTMENT_OTHER)
Admission: RE | Admit: 2022-06-04 | Discharge: 2022-06-04 | Disposition: A | Discharge status: Home / Self Care | Payer: Medicaid Other | Source: Ambulatory Visit | Attending: Obstetrics and Gynecology | Admitting: Obstetrics and Gynecology

## 2022-06-04 DIAGNOSIS — R1032 Left lower quadrant pain: Secondary | ICD-10-CM | POA: Insufficient documentation

## 2022-10-18 ENCOUNTER — Emergency Department (HOSPITAL_COMMUNITY): Payer: Medicaid Other

## 2022-10-18 ENCOUNTER — Other Ambulatory Visit: Payer: Self-pay

## 2022-10-18 ENCOUNTER — Emergency Department (HOSPITAL_COMMUNITY)
Admission: EM | Admit: 2022-10-18 | Discharge: 2022-10-19 | Disposition: A | Discharge status: Home / Self Care | Payer: Medicaid Other | Attending: Emergency Medicine | Admitting: Emergency Medicine

## 2022-10-18 DIAGNOSIS — J45909 Unspecified asthma, uncomplicated: Secondary | ICD-10-CM | POA: Insufficient documentation

## 2022-10-18 DIAGNOSIS — F419 Anxiety disorder, unspecified: Secondary | ICD-10-CM | POA: Diagnosis not present

## 2022-10-18 DIAGNOSIS — Z91010 Allergy to peanuts: Secondary | ICD-10-CM | POA: Diagnosis not present

## 2022-10-18 DIAGNOSIS — I10 Essential (primary) hypertension: Secondary | ICD-10-CM | POA: Insufficient documentation

## 2022-10-18 DIAGNOSIS — R569 Unspecified convulsions: Secondary | ICD-10-CM | POA: Diagnosis present

## 2022-10-18 LAB — BASIC METABOLIC PANEL
Anion gap: 12 (ref 5–15)
BUN: 14 mg/dL (ref 6–20)
CO2: 19 mmol/L — ABNORMAL LOW (ref 22–32)
Calcium: 9.2 mg/dL (ref 8.9–10.3)
Chloride: 108 mmol/L (ref 98–111)
Creatinine, Ser: 0.86 mg/dL (ref 0.44–1.00)
GFR, Estimated: >60 mL/min (ref >60)
Glucose, Bld: 102 mg/dL — ABNORMAL HIGH (ref 70–99)
Potassium: 3 mmol/L — ABNORMAL LOW (ref 3.5–5.1)
Sodium: 139 mmol/L (ref 135–145)

## 2022-10-18 LAB — CBC WITH DIFFERENTIAL/PLATELET
Abs Immature Granulocytes: 0.01 10*3/uL (ref 0.00–0.07)
Basophils Absolute: 0 10*3/uL (ref 0.0–0.1)
Basophils Relative: 1 %
Eosinophils Absolute: 0.1 10*3/uL (ref 0.0–0.5)
Eosinophils Relative: 2 %
HCT: 38.4 % (ref 36.0–46.0)
Hemoglobin: 13.4 g/dL (ref 12.0–15.0)
Immature Granulocytes: 0 %
Lymphocytes Relative: 55 %
Lymphs Abs: 4.2 10*3/uL — ABNORMAL HIGH (ref 0.7–4.0)
MCH: 32.3 pg (ref 26.0–34.0)
MCHC: 34.9 g/dL (ref 30.0–36.0)
MCV: 92.5 fL (ref 80.0–100.0)
Monocytes Absolute: 0.5 10*3/uL (ref 0.1–1.0)
Monocytes Relative: 6 %
Neutro Abs: 2.7 10*3/uL (ref 1.7–7.7)
Neutrophils Relative %: 36 %
Platelets: 231 10*3/uL (ref 150–400)
RBC: 4.15 MIL/uL (ref 3.87–5.11)
RDW: 12.3 % (ref 11.5–15.5)
WBC: 7.6 10*3/uL (ref 4.0–10.5)
nRBC: 0 % (ref 0.0–0.2)

## 2022-10-18 LAB — TROPONIN I (HIGH SENSITIVITY)
Troponin I (High Sensitivity): 2 ng/L (ref <18)
Troponin I (High Sensitivity): 2 ng/L (ref <18)

## 2022-10-18 LAB — I-STAT CHEM 8, ED
BUN: 16 mg/dL (ref 6–20)
Calcium, Ion: 1.05 mmol/L — ABNORMAL LOW (ref 1.15–1.40)
Chloride: 110 mmol/L (ref 98–111)
Creatinine, Ser: 0.8 mg/dL (ref 0.44–1.00)
Glucose, Bld: 99 mg/dL (ref 70–99)
HCT: 40 % (ref 36.0–46.0)
Hemoglobin: 13.6 g/dL (ref 12.0–15.0)
Potassium: 3.6 mmol/L (ref 3.5–5.1)
Sodium: 140 mmol/L (ref 135–145)
TCO2: 20 mmol/L — ABNORMAL LOW (ref 22–32)

## 2022-10-18 LAB — HCG, QUANTITATIVE, PREGNANCY: hCG, Beta Chain, Quant, S: <1 m[IU]/mL (ref <5)

## 2022-10-18 LAB — CK: Total CK: 96 U/L (ref 38–234)

## 2022-10-18 LAB — MAGNESIUM: Magnesium: 1.9 mg/dL (ref 1.7–2.4)

## 2022-10-18 MED ORDER — LORAZEPAM 2 MG/ML IJ SOLN
1.0000 mg | Freq: Once | INTRAMUSCULAR | Status: AC
  Administered 2022-10-18: 1 mg via INTRAVENOUS
  Filled 2022-10-18: qty 1

## 2022-10-18 NOTE — ED Triage Notes (Addendum)
BIBA from home with c/o sob, n/v, and migraine.  In route seizure x3 lasting 1 min each.  2mg  versed and 4mg  Zofran given by EMS.  Hx of seizures and no longer on keppra, awaiting neurology appt.  Pt responding to commands on arrival and A&O x4.  Cbg-95 on arrival.

## 2022-10-18 NOTE — ED Notes (Signed)
Patient transported to CT 

## 2022-10-18 NOTE — ED Provider Notes (Signed)
  Provider Note MRN:  [id number]  Arrival date & time: 10/19/22    ED Course and Medical Decision Making  Assumed care from Dr. Jearld Fenton at shift change.  Question seizure activity versus panic episode.  Favoring the latter.  Patient requiring more observation time after Ativan, if continues to look well would be appropriate for discharge.  Had some transient reported left-sided deficits that resolved spontaneously.  12:30 AM update: Patient wakes easily, feels a lot better, normal neurological exam without focal deficits, appropriate for discharge.  Procedures  Final Clinical Impressions(s) / ED Diagnoses     ICD-10-CM   1. Seizure-like activity (HCC)  R56.9     2. Anxiety  F41.9       ED Discharge Orders     None         Discharge Instructions      You were evaluated in the Emergency Department and after careful evaluation, we did not find any emergent condition requiring admission or further testing in the hospital.  Your exam/testing today is overall reassuring.  Recommend close follow-up with primary care doctor to discuss her symptoms.  Please return to the Emergency Department if you experience any worsening of your condition.   Thank you for allowing [place] to be a part of your care.      Elmer Sow. Pilar Plate, MD [facility] Emergency Medicine [facility] [email or web address]    Sabas Sous, MD 10/19/22 [phone]

## 2022-10-18 NOTE — ED Notes (Signed)
Pt reports outside and started having a headache described as pounding and going numb on left side.  MD aware

## 2022-10-18 NOTE — ED Provider Notes (Signed)
[place] EMERGENCY DEPARTMENT AT [facility] Provider Note   CSN: [id number] Arrival date & time: 10/18/22  1956     History {Add pertinent medical, surgical, social history, OB history to HPI:1} Chief Complaint  Patient presents with   Seizures    Theresa Marshall is a 27 y.o. female with h/o seizures, asthma who presents with seizure-like activity, SOB, chest pain.  Patient presents brought in by EMS due to concern for seizure.  She reports that she does have a history of seizures for the last 1 was 2 years ago and she does not currently take any Keppra.  She states that she was outside earlier and felt a migraine coming on.  She also felt left-sided numbness which she is related to her seizures in the past.  She then began to get very "terrified," and she tried to get inside.  She does not think she lost consciousness but she began shaking and had a stabbing back pain, shortness of breath, chest pain, nausea and vomiting.  She is very concerned because she "does not want to have more seizures, thought they were controlled, worried she is getting sick again."  She is also been very stressed throughout the day.   EMS noted seizure-like activity, all of her shaking, gave her 2 mg IM Versed.  On arrival she is shaking diffusely but is responsive to questions, A&O x 4, alert.  She states that she feels numbness and tingling in her bilateral feet and she states "I cannot stop shaking."    Seizures      Home Medications Prior to Admission medications   Medication Sig Start Date End Date Taking? Authorizing Provider  acetaminophen (TYLENOL) 325 MG tablet Take 2 tablets (650 mg total) by mouth every 4 (four) hours as needed (for pain scale < 4). Patient taking differently: Take 650 mg by mouth 3 (three) times daily as needed for mild pain or headache.  04/27/19   Taam-Akelman, Griselda Miner, MD  acyclovir cream (ZOVIRAX) 5 % Apply 1 application topically 3 (three) times daily as needed  (Rash).    [provider]  aspirin-acetaminophen-caffeine (EXCEDRIN MIGRAINE) [phone] MG tablet Take 2 tablets by mouth 3 (three) times daily as needed for headache.    [provider]  butalbital-acetaminophen-caffeine (FIORICET) 50-325-40 MG tablet Take 1 tablet as needed for severe headache. Do not take more than 2-3 a week Patient not taking: Reported on 09/05/2019 12/21/18   Van Clines, MD  cetirizine (ZYRTEC) 10 MG tablet Take 10 mg by mouth daily as needed for allergies.    [provider]  docusate sodium (COLACE) 100 MG capsule Take 1 capsule (100 mg total) by mouth 2 (two) times daily. Patient not taking: Reported on 09/05/2019 04/27/19   Rande Brunt, MD  ferrous sulfate (FERROUSUL) 325 (65 FE) MG tablet Take 1 tablet (325 mg total) by mouth daily with breakfast. 04/27/19   Taam-Akelman, Griselda Miner, MD  ibuprofen (ADVIL) 800 MG tablet Take 1 tablet (800 mg total) by mouth every 8 (eight) hours as needed. 02/21/20   Thressa Sheller D, CNM  oxyCODONE-acetaminophen (PERCOCET) 5-325 MG tablet Take 1 tablet by mouth every 6 (six) hours as needed for severe pain. Patient not taking: Reported on 09/05/2019 04/27/19   Rande Brunt, MD  pantoprazole (PROTONIX) 20 MG tablet Take 20 mg by mouth daily.    [provider]  Prenatal Vit-Fe Fumarate-FA (MULTIVITAMIN-PRENATAL) 27-0.8 MG TABS tablet Take 1 tablet by mouth daily  at 12 noon.    [provider]  sertraline (ZOLOFT) 50 MG tablet Take 50 mg by mouth daily. 05/28/19   [provider]  VENTOLIN HFA 108 (90 Base) MCG/ACT inhaler Inhale 2 puffs into the lungs every 6 (six) hours as needed for wheezing. 11/28/18   Raelyn Mora, CNM  Burr Medico 150-35 MCG/24HR transdermal patch Place 1 patch onto the skin once a week. 07/24/19   [provider]      Allergies    Metronidazole, Peanut oil, Pecan nut (diagnostic), and Penicillins    Review of Systems   Review of Systems   Neurological:  Positive for seizures.   A 10 point review of systems was performed and is negative unless otherwise reported in HPI.  Physical Exam Updated Vital Signs BP 108/82   Pulse 67   Temp 98 F (36.7 C)   Resp 13   Wt 54 kg   SpO2 99%   BMI 19.21 kg/m  Physical Exam General: Female in moderate emotional distress, lying in bed. Diffuse trembling. HEENT: PERRLA midrange, EOMI,  Sclera anicteric, MMM, trachea midline.  Cardiology: RRR, no murmurs/rubs/gallops. BL radial and DP pulses equal bilaterally.  Resp: Normal respiratory rate and effort. CTAB, no wheezes, rhonchi, crackles.  Abd: Soft, non-tender, non-distended. No rebound tenderness or guarding.  GU: Deferred. MSK: No peripheral edema or signs of trauma. Extremities without deformity or TTP. No cyanosis or clubbing. Skin: warm, dry. No rashes or lesions. Back: No CVA tenderness Neuro: A&Ox4, CNs II-XII grossly intact. MAEs. Sensation grossly intact.  Psych: Extremely anxious/tearful mood and affect.   ED Results / Procedures / Treatments   Labs (all labs ordered are listed, but only abnormal results are displayed) Labs Reviewed  CBC WITH DIFFERENTIAL/PLATELET - Abnormal; Notable for the following components:      Result Value   Lymphs Abs 4.2 (*)    All other components within normal limits  BASIC METABOLIC PANEL - Abnormal; Notable for the following components:   Potassium 3.0 (*)    CO2 19 (*)    Glucose, Bld 102 (*)    All other components within normal limits  I-STAT CHEM 8, ED - Abnormal; Notable for the following components:   Calcium, Ion 1.05 (*)    TCO2 20 (*)    All other components within normal limits  HCG, QUANTITATIVE, PREGNANCY  CK  MAGNESIUM  TROPONIN I (HIGH SENSITIVITY)    EKG EKG Interpretation Date/Time:  Tuesday October 18 2022 19:57:54 EDT Ventricular Rate:  82 PR Interval:  168 QRS Duration:  124 QT Interval:  382 QTC Calculation: 447 R Axis:   80  Text  Interpretation: Sinus rhythm Atrial premature complex IVCD, consider atypical RBBB Similar to prior Confirmed by Vivi Barrack [phone]) on 10/18/2022 9:49:10 PM  Radiology CT Head Wo Contrast  Result Date: 10/18/2022 CLINICAL DATA:  Headache EXAM: CT HEAD WITHOUT CONTRAST TECHNIQUE: Contiguous axial images were obtained from the base of the skull through the vertex without intravenous contrast. RADIATION DOSE REDUCTION: This exam was performed according to the departmental dose-optimization program which includes automated exposure control, adjustment of the mA and/or kV according to patient size and/or use of iterative reconstruction technique. COMPARISON:  09/04/2019 FINDINGS: Brain: No evidence of acute infarction, hemorrhage, hydrocephalus, extra-axial collection or mass lesion/mass effect. Vascular: No hyperdense vessel or unexpected calcification. Skull: Normal. Negative for fracture or focal lesion. Sinuses/Orbits: The visualized paranasal sinuses are essentially clear. The mastoid air cells are unopacified. Other: None. IMPRESSION: Normal head CT.  Electronically Signed   By: Charline Bills M.D.   On: 10/18/2022 21:44    Procedures Procedures  {Document cardiac monitor, telemetry assessment procedure when appropriate:1}  Medications Ordered in ED Medications  LORazepam (ATIVAN) injection 1 mg (1 mg Intravenous Given 10/18/22 2009)    ED Course/ Medical Decision Making/ A&P                          Medical Decision Making Amount and/or Complexity of Data Reviewed Labs: ordered. Radiology: ordered.  Risk Prescription drug management.    This patient presents to the ED for concern of seizure-like activity, various complaints; this involves an extensive number of treatment options, and is a complaint that carries with it a high risk of complications and morbidity.  I considered the following differential and admission for this acute, potentially life threatening condition. She is vitally  stable and responsive on arrival.  MDM:    ***Causes of seizures include:  Intracranial hemorrhage, brain tumor or abscess, meningitis or encephalitis, head injury or TBI, eclampsia, hypoxic-ischemic injury, or underlying epilepsy, vascular malformation such as AVM, acute hydrocephalus, neurocysticercosis, PRES.  Alcohol/drug withdrawal, toxic ingestion, electrolyte abnormalities (hyponatremia/hypernatremia, hypomagnesemia, hypocalcemia), hypoglycemia, uremia, hyperthyroidism, non-epileptic seizure.  Overall I have lower suspicion for seizure today as patient appears to have symptoms consistent with panic attack.    Clinical Course as of 10/18/22 2152  Tue Oct 18, 2022  2146 Trop, CK, CBC, Mg unremarkable. Mild hypokalemia [HN]    Clinical Course User Index [HN] Loetta Rough, MD    Labs: I Ordered, and personally interpreted labs.  The pertinent results include:  ***  Imaging Studies ordered: I ordered imaging studies including *** I independently visualized and interpreted imaging. I agree with the radiologist interpretation  Additional history obtained from ***.  External records from outside source obtained and reviewed including ***  Cardiac Monitoring: The patient was maintained on a cardiac monitor.  I personally viewed and interpreted the cardiac monitored which showed an underlying rhythm of: ***  Reevaluation: After the interventions noted above, I reevaluated the patient and found that they have :{resolved/improved/worsened:23923::"improved"}  Social Determinants of Health: ***  Disposition:  ***  Co morbidities that complicate the patient evaluation  Past Medical History:  Diagnosis Date   Anemia    Anxiety    Asthma    exercise induced   Hypertension    Infection    UTI, yeast   Ovarian cyst    Pregnancy    Seizures (HCC)    last seizure 04/21/2019   Vaginosis      Medicines Meds ordered this encounter  Medications   LORazepam (ATIVAN)  injection 1 mg    I have reviewed the patients home medicines and have made adjustments as needed  Problem List / ED Course: Problem List Items Addressed This Visit   None        {Document critical care time when appropriate:1} {Document review of labs and clinical decision tools ie heart score, Chads2Vasc2 etc:1}  {Document your independent review of radiology images, and any outside records:1} {Document your discussion with family members, caretakers, and with consultants:1} {Document social determinants of health affecting pt's care:1} {Document your decision making why or why not admission, treatments were needed:1}  This note was created using dictation software, which may contain spelling or grammatical errors.

## 2022-10-19 LAB — CBG MONITORING, ED: Glucose-Capillary: 95 mg/dL (ref 70–99)

## 2022-10-19 NOTE — Discharge Instructions (Signed)
You were evaluated in the Emergency Department and after careful evaluation, we did not find any emergent condition requiring admission or further testing in the hospital.  Your exam/testing today is overall reassuring.  Recommend close follow-up with primary care doctor to discuss her symptoms.  Please return to the Emergency Department if you experience any worsening of your condition.   Thank you for allowing [place] to be a part of your care.

## 2022-12-20 ENCOUNTER — Emergency Department (HOSPITAL_COMMUNITY)
Admission: EM | Admit: 2022-12-20 | Discharge: 2022-12-20 | Disposition: A | Discharge status: Home / Self Care | Payer: Medicaid Other | Attending: Emergency Medicine | Admitting: Emergency Medicine

## 2022-12-20 ENCOUNTER — Other Ambulatory Visit: Payer: Self-pay

## 2022-12-20 DIAGNOSIS — Z7982 Long term (current) use of aspirin: Secondary | ICD-10-CM | POA: Diagnosis not present

## 2022-12-20 DIAGNOSIS — G40909 Epilepsy, unspecified, not intractable, without status epilepticus: Secondary | ICD-10-CM | POA: Insufficient documentation

## 2022-12-20 DIAGNOSIS — Z79899 Other long term (current) drug therapy: Secondary | ICD-10-CM | POA: Diagnosis not present

## 2022-12-20 DIAGNOSIS — D649 Anemia, unspecified: Secondary | ICD-10-CM | POA: Diagnosis not present

## 2022-12-20 DIAGNOSIS — R569 Unspecified convulsions: Secondary | ICD-10-CM

## 2022-12-20 DIAGNOSIS — Z91010 Allergy to peanuts: Secondary | ICD-10-CM | POA: Diagnosis not present

## 2022-12-20 DIAGNOSIS — G43A0 Cyclical vomiting, not intractable: Secondary | ICD-10-CM | POA: Insufficient documentation

## 2022-12-20 DIAGNOSIS — E876 Hypokalemia: Secondary | ICD-10-CM | POA: Diagnosis not present

## 2022-12-20 DIAGNOSIS — D72829 Elevated white blood cell count, unspecified: Secondary | ICD-10-CM | POA: Diagnosis not present

## 2022-12-20 LAB — CBC WITH DIFFERENTIAL/PLATELET
Abs Immature Granulocytes: 0.01 10*3/uL (ref 0.00–0.07)
Basophils Absolute: 0 10*3/uL (ref 0.0–0.1)
Basophils Relative: 1 %
Eosinophils Absolute: 0 10*3/uL (ref 0.0–0.5)
Eosinophils Relative: 1 %
HCT: 37.5 % (ref 36.0–46.0)
Hemoglobin: 12.9 g/dL (ref 12.0–15.0)
Immature Granulocytes: 0 %
Lymphocytes Relative: 50 %
Lymphs Abs: 1.8 10*3/uL (ref 0.7–4.0)
MCH: 31.7 pg (ref 26.0–34.0)
MCHC: 34.4 g/dL (ref 30.0–36.0)
MCV: 92.1 fL (ref 80.0–100.0)
Monocytes Absolute: 0.3 10*3/uL (ref 0.1–1.0)
Monocytes Relative: 8 %
Neutro Abs: 1.4 10*3/uL — ABNORMAL LOW (ref 1.7–7.7)
Neutrophils Relative %: 40 %
Platelets: 237 10*3/uL (ref 150–400)
RBC: 4.07 MIL/uL (ref 3.87–5.11)
RDW: 12.1 % (ref 11.5–15.5)
WBC: 3.6 10*3/uL — ABNORMAL LOW (ref 4.0–10.5)
nRBC: 0 % (ref 0.0–0.2)

## 2022-12-20 LAB — BASIC METABOLIC PANEL
Anion gap: 11 (ref 5–15)
BUN: 10 mg/dL (ref 6–20)
CO2: 21 mmol/L — ABNORMAL LOW (ref 22–32)
Calcium: 9.5 mg/dL (ref 8.9–10.3)
Chloride: 107 mmol/L (ref 98–111)
Creatinine, Ser: 0.8 mg/dL (ref 0.44–1.00)
GFR, Estimated: >60 mL/min (ref >60)
Glucose, Bld: 89 mg/dL (ref 70–99)
Potassium: 3.1 mmol/L — ABNORMAL LOW (ref 3.5–5.1)
Sodium: 139 mmol/L (ref 135–145)

## 2022-12-20 LAB — HCG, SERUM, QUALITATIVE: Preg, Serum: NEGATIVE

## 2022-12-20 MED ORDER — ACETAMINOPHEN 325 MG PO TABS
650.0000 mg | ORAL_TABLET | Freq: Once | ORAL | Status: AC
  Administered 2022-12-20: 650 mg via ORAL
  Filled 2022-12-20: qty 2

## 2022-12-20 MED ORDER — ONDANSETRON HCL 4 MG/2ML IJ SOLN
4.0000 mg | Freq: Once | INTRAMUSCULAR | Status: AC
  Administered 2022-12-20: 4 mg via INTRAVENOUS
  Filled 2022-12-20: qty 2

## 2022-12-20 MED ORDER — POTASSIUM CHLORIDE 20 MEQ PO PACK
40.0000 meq | PACK | Freq: Once | ORAL | Status: AC
  Administered 2022-12-20: 40 meq via ORAL
  Filled 2022-12-20: qty 2

## 2022-12-20 MED ORDER — LEVETIRACETAM 500 MG PO TABS: 500.0000 mg | ORAL_TABLET | Freq: Two times a day (BID) | ORAL | 0 refills | Status: AC

## 2022-12-20 MED ORDER — SODIUM CHLORIDE 0.9 % IV BOLUS
1000.0000 mL | Freq: Once | INTRAVENOUS | Status: AC
  Administered 2022-12-20: 1000 mL via INTRAVENOUS

## 2022-12-20 MED ORDER — LEVETIRACETAM IN NACL 1000 MG/100ML IV SOLN
1000.0000 mg | Freq: Once | INTRAVENOUS | Status: AC
  Administered 2022-12-20: 1000 mg via INTRAVENOUS
  Filled 2022-12-20: qty 100

## 2022-12-20 NOTE — Discharge Instructions (Signed)
You  was seen in the emergency department after another seizure episode and for nausea and vomiting We gave you a dose of Keppra while you are here in the ED After speaking with Dr.Aquino, we have called in a prescription for Keppra for you to pick up from your pharmacy.  You should begin taking 500 mg twice daily It is important that you follow-up with your neurologist in the office for more testing.  Please call Dr.Aquino's office to schedule appointment As discussed, you should not drive until cleared by neurologist Also stay away from alcohol as this can lower the seizure threshold Return to the emergency department for recurrent sutures or any other concerns

## 2022-12-20 NOTE — ED Provider Notes (Signed)
[place] EMERGENCY DEPARTMENT AT [facility] Provider Note   CSN: [id number] Arrival date & time: 12/20/22  1127     History  Chief Complaint  Patient presents with   Seizures   Headache   Nausea    Theresa Marshall is a 27 y.o. female.  The patient is a 27 year old female with a past medical history of seizures, headaches and anxiety disorder who presents to the ED after suspected seizure.  She reports an estimated 2 or 3 unwitnessed seizure episodes late last night.  She did not seek evaluation at that time.  This morning she awoke feeling very fatigued with a right-sided headache which is typical after her seizure episodes.  She also notes associated nausea but has not vomited.  No fevers, chills, recent illness, changes in vision, or focal weakness.  She was seen in the emergency department last month after a headache with associated right-sided weakness but has not followed up with a neurologist since that time.   Seizures Headache Associated symptoms: seizures        Home Medications Prior to Admission medications   Medication Sig Start Date End Date Taking? Authorizing Provider  levETIRAcetam (KEPPRA) 500 MG tablet Take 1 tablet (500 mg total) by mouth 2 (two) times daily. 12/20/22 01/19/23 Yes Royanne Foots, DO  acetaminophen (TYLENOL) 325 MG tablet Take 2 tablets (650 mg total) by mouth every 4 (four) hours as needed (for pain scale < 4). Patient taking differently: Take 650 mg by mouth 3 (three) times daily as needed for mild pain or headache.  04/27/19   Taam-Akelman, Griselda Miner, MD  acyclovir cream (ZOVIRAX) 5 % Apply 1 application topically 3 (three) times daily as needed (Rash).    [provider]  aspirin-acetaminophen-caffeine (EXCEDRIN MIGRAINE) [phone] MG tablet Take 2 tablets by mouth 3 (three) times daily as needed for headache.    [provider]  butalbital-acetaminophen-caffeine (FIORICET) 50-325-40 MG tablet Take 1 tablet as  needed for severe headache. Do not take more than 2-3 a week Patient not taking: Reported on 09/05/2019 12/21/18   Van Clines, MD  cetirizine (ZYRTEC) 10 MG tablet Take 10 mg by mouth daily as needed for allergies.    [provider]  docusate sodium (COLACE) 100 MG capsule Take 1 capsule (100 mg total) by mouth 2 (two) times daily. Patient not taking: Reported on 09/05/2019 04/27/19   Rande Brunt, MD  ferrous sulfate (FERROUSUL) 325 (65 FE) MG tablet Take 1 tablet (325 mg total) by mouth daily with breakfast. 04/27/19   Taam-Akelman, Griselda Miner, MD  ibuprofen (ADVIL) 800 MG tablet Take 1 tablet (800 mg total) by mouth every 8 (eight) hours as needed. 02/21/20   Thressa Sheller D, CNM  oxyCODONE-acetaminophen (PERCOCET) 5-325 MG tablet Take 1 tablet by mouth every 6 (six) hours as needed for severe pain. Patient not taking: Reported on 09/05/2019 04/27/19   Rande Brunt, MD  pantoprazole (PROTONIX) 20 MG tablet Take 20 mg by mouth daily.    [provider]  Prenatal Vit-Fe Fumarate-FA (MULTIVITAMIN-PRENATAL) 27-0.8 MG TABS tablet Take 1 tablet by mouth daily at 12 noon.    [provider]  sertraline (ZOLOFT) 50 MG tablet Take 50 mg by mouth daily. 05/28/19   [provider]  VENTOLIN HFA 108 (90 Base) MCG/ACT inhaler Inhale 2 puffs into the lungs every 6 (six) hours as needed for wheezing. 11/28/18   Raelyn Mora, CNM  Burr Medico 150-35 MCG/24HR transdermal patch Place  1 patch onto the skin once a week. 07/24/19   [provider]      Allergies    Metronidazole, Peanut oil, Pecan nut (diagnostic), and Penicillins    Review of Systems   Review of Systems  Neurological:  Positive for seizures and headaches.  All other systems reviewed and are negative.   Physical Exam Updated Vital Signs BP 116/81   Pulse 69   Temp 98.4 F (36.9 C) (Oral)   Resp 13   Ht 5\' 6"  (1.676 m)   Wt 54 kg   LMP 12/13/2022   SpO2 100%   BMI 19.21 kg/m   Physical Exam Vitals reviewed.  HENT:     Head: Normocephalic.  Eyes:     General: No visual field deficit.    Extraocular Movements: Extraocular movements intact.     Pupils: Pupils are equal, round, and reactive to light.  Cardiovascular:     Rate and Rhythm: Normal rate and regular rhythm.  Pulmonary:     Effort: Pulmonary effort is normal.     Breath sounds: Normal breath sounds.  Abdominal:     Palpations: Abdomen is soft.     Tenderness: There is no abdominal tenderness.  Musculoskeletal:     Cervical back: Normal range of motion and neck supple.  Skin:    General: Skin is warm and dry.  Neurological:     Mental Status: She is alert and oriented to person, place, and time.     Cranial Nerves: No cranial nerve deficit, dysarthria or facial asymmetry.     Sensory: No sensory deficit.     Motor: No weakness.     Coordination: Coordination normal.  Psychiatric:        Mood and Affect: Mood normal.     ED Results / Procedures / Treatments   Labs (all labs ordered are listed, but only abnormal results are displayed) Labs Reviewed  BASIC METABOLIC PANEL - Abnormal; Notable for the following components:      Result Value   Potassium 3.1 (*)    CO2 21 (*)    All other components within normal limits  CBC WITH DIFFERENTIAL/PLATELET - Abnormal; Notable for the following components:   WBC 3.6 (*)    Neutro Abs 1.4 (*)    All other components within normal limits  HCG, SERUM, QUALITATIVE    EKG None  Radiology No results found.  Procedures Procedures    Medications Ordered in ED Medications  ondansetron (ZOFRAN) injection 4 mg (4 mg Intravenous Given 12/20/22 1321)  sodium chloride 0.9 % bolus 1,000 mL (0 mLs Intravenous Stopped 12/20/22 1446)  levETIRAcetam (KEPPRA) IVPB 1000 mg/100 mL premix (0 mg Intravenous Stopped 12/20/22 1446)  acetaminophen (TYLENOL) tablet 650 mg (650 mg Oral Given 12/20/22 1321)  potassium chloride (KLOR-CON) packet 40 mEq (40 mEq Oral  Given 12/20/22 1435)    ED Course/ Medical Decision Making/ A&P                                 Medical Decision Making 27 year old female with history of seizure disorder, headaches and anxiety presents for residual headaches today following suspected seizure episodes last night.  Reviewed ED note of 11/27/2022 when patient presented after 3 days of worsening headache with right sided weakness.  CT head without contrast as well as CTA of the brain and neck were negative at that time she was treated for a complex migraine headache  with IV fluids, Compazine, Benadryl, Decadron and Tylenol.  She has not followed up with a neurologist since that time and is not currently taking antiepileptics.  She was previously on Keppra for seizures years ago but has not taken this medication since 2019.  Today she reports having 2 or 3 seizure episodes last night which were unwitnessed.  This morning she had residual discomfort in both of her legs and headaches which are characteristic after her seizure episodes.  Some associated nausea but no vomiting.  No fevers chills or recent illness.  No focal neurologic deficits on my exam.  Given multiple seizure-like episodes last night with a recent visit in August and in July provide IV Keppra load with the hope of preventing additional episodes.  Will give IV fluids, Zofran and Tylenol for symptomatic relief.  Will evaluate for electrolyte abnormality with metabolic panel as well as anemia and leukocytosis with CBC.  Will also obtain serum pregnancy test.  Patient does state her last menstrual period was 1 week ago.  Given benign neurologic exam and recent imaging 1 month ago, no need for repeat CT head or CTA head neck at this time.  Will attempt to reach out to her neurology team (Dr. Lysle Rubens) for further guidance regarding resuming her Keppra as an outpatient   Amount and/or Complexity of Data Reviewed Labs: ordered.  Risk OTC drugs. Prescription drug  management. Risk Details: 2:56 PM Reassessed patient.  Patient reports improvement in her headache and nausea after the above interventions.  No recurrent seizure activity.  I was able to reach her neurologist Dr. Karel Jarvis who agrees with plan to resume Keppra as an outpatient (500 mg twice daily) and will follow-up with the patient in her office.  Instructed patient not to drive until cleared by neurologist   3:03 PM reviewed lab work.No significant abnormalities noted on CBC.  Metabolic panel notable for hypokalemia of 3.1.  Will provide oral repletion.  Based on review of EMR she has had episodic hypokalemia in the past.  No other significant metabolic abnormalities.  hCG negative.        Final Clinical Impression(s) / ED Diagnoses Final diagnoses:  Seizure (HCC)  Hypokalemia  Cyclical vomiting associated with nonintractable migraine    Rx / DC Orders ED Discharge Orders          Ordered    levETIRAcetam (KEPPRA) 500 MG tablet  2 times daily        12/20/22 1502              Royanne Foots, DO 12/20/22 1503

## 2022-12-20 NOTE — ED Triage Notes (Signed)
Pt arrive reporting sz last night. States hx of seizures. Was taken off her sz medication 3 years ago. Denies head injury or any other injury. States she has headache and R side pain which is how she feels after a sz. No other symptoms reported

## 2023-01-02 ENCOUNTER — Encounter: Payer: Self-pay | Admitting: Neurology

## 2023-01-20 ENCOUNTER — Ambulatory Visit: Payer: Medicaid Other | Admitting: Neurology

## 2023-03-06 ENCOUNTER — Ambulatory Visit: Payer: Medicaid Other | Admitting: Neurology

## 2023-05-10 ENCOUNTER — Ambulatory Visit: Payer: Medicaid Other | Admitting: Neurology

## 2023-09-14 ENCOUNTER — Other Ambulatory Visit: Payer: Self-pay

## 2023-09-14 ENCOUNTER — Encounter (HOSPITAL_COMMUNITY): Payer: Self-pay | Admitting: Emergency Medicine

## 2023-09-14 ENCOUNTER — Emergency Department (HOSPITAL_COMMUNITY)
Admission: EM | Admit: 2023-09-14 | Discharge: 2023-09-14 | Disposition: A | Discharge status: Home / Self Care | Attending: Emergency Medicine | Admitting: Emergency Medicine

## 2023-09-14 DIAGNOSIS — Z7982 Long term (current) use of aspirin: Secondary | ICD-10-CM | POA: Diagnosis not present

## 2023-09-14 DIAGNOSIS — R109 Unspecified abdominal pain: Secondary | ICD-10-CM | POA: Insufficient documentation

## 2023-09-14 DIAGNOSIS — R110 Nausea: Secondary | ICD-10-CM

## 2023-09-14 DIAGNOSIS — R112 Nausea with vomiting, unspecified: Secondary | ICD-10-CM | POA: Diagnosis present

## 2023-09-14 DIAGNOSIS — Z91010 Allergy to peanuts: Secondary | ICD-10-CM | POA: Diagnosis not present

## 2023-09-14 DIAGNOSIS — Z3202 Encounter for pregnancy test, result negative: Secondary | ICD-10-CM | POA: Diagnosis not present

## 2023-09-14 LAB — RAPID HIV SCREEN (HIV 1/2 AB+AG)
HIV 1/2 Antibodies: NONREACTIVE
HIV-1 P24 Antigen - HIV24: NONREACTIVE

## 2023-09-14 LAB — WET PREP, GENITAL
Sperm: NONE SEEN
Trich, Wet Prep: NONE SEEN
WBC, Wet Prep HPF POC: <10 (ref <10)
Yeast Wet Prep HPF POC: NONE SEEN

## 2023-09-14 LAB — URINALYSIS, ROUTINE W REFLEX MICROSCOPIC
Bacteria, UA: NONE SEEN
Bilirubin Urine: NEGATIVE
Glucose, UA: NEGATIVE mg/dL
Ketones, ur: NEGATIVE mg/dL
Leukocytes,Ua: NEGATIVE
Nitrite: NEGATIVE
Protein, ur: NEGATIVE mg/dL
Specific Gravity, Urine: 1.013 (ref 1.005–1.030)
pH: 7 (ref 5.0–8.0)

## 2023-09-14 LAB — HCG, QUANTITATIVE, PREGNANCY: hCG, Beta Chain, Quant, S: <1 m[IU]/mL (ref <5)

## 2023-09-14 NOTE — ED Triage Notes (Signed)
 Patient comes in with nausea, hot flashes, cramping, and headaches, diarrhea and vomiting. Home pregnancy test was negative.  This has been three weeks of not feeling well.  G1P1

## 2023-09-14 NOTE — Discharge Instructions (Addendum)
 Discussed results of your blood work on today's visit.  Follow-up with your primary care physician as needed.

## 2023-09-14 NOTE — ED Provider Notes (Signed)
 [place] EMERGENCY DEPARTMENT AT [facility] Provider Note   CSN: [id number] Arrival date & time: 09/14/23  1324     History  Chief Complaint  Patient presents with   Nausea    Theresa Marshall is a 28 y.o. female.  28 year old with no past medical history presents to the ED with a chief complaint of nausea, abdominal cramping, overall feeling unwell for the past several weeks.  She is also feeling some tenderness to her breast, last menstrual period ended on Aug 19, 2023.  She is concerned for pregnancy at this time, she is having the same symptoms that she previously had during her pregnancies.  She has a prior history of hyperemesis gravidarum.  She has taken some over-the-counter medication to help with her nausea but there has not been any improvement in symptoms.  She is with a known boyfriend for the past 7 months.  Reports has been highly sexually active over the past month.  Some concern as vomiting does have some green bile to it.  No fever, no vaginal discharge, no urinary symptoms, no upper respiratory symptoms.  The history is provided by the patient.       Home Medications Prior to Admission medications   Medication Sig Start Date End Date Taking? Authorizing Provider  acetaminophen  (TYLENOL ) 325 MG tablet Take 2 tablets (650 mg total) by mouth every 4 (four) hours as needed (for pain scale < 4). Patient taking differently: Take 650 mg by mouth 3 (three) times daily as needed for mild pain or headache.  04/27/19   Taam-Akelman, Rosalea K, MD  acyclovir cream (ZOVIRAX) 5 % Apply 1 application topically 3 (three) times daily as needed (Rash).    [provider]  aspirin-acetaminophen -caffeine  (EXCEDRIN MIGRAINE) 250-250-65 MG tablet Take 2 tablets by mouth 3 (three) times daily as needed for headache.    [provider]  butalbital -acetaminophen -caffeine  (FIORICET ) 50-325-40 MG tablet Take 1 tablet as needed for severe headache. Do not take more  than 2-3 a week Patient not taking: Reported on 09/05/2019 12/21/18   Jhonny Moss, MD  cetirizine  (ZYRTEC ) 10 MG tablet Take 10 mg by mouth daily as needed for allergies.    [provider]  docusate sodium  (COLACE) 100 MG capsule Take 1 capsule (100 mg total) by mouth 2 (two) times daily. Patient not taking: Reported on 09/05/2019 04/27/19   Taam-Akelman, Rosalea K, MD  ferrous sulfate  (FERROUSUL) 325 (65 FE) MG tablet Take 1 tablet (325 mg total) by mouth daily with breakfast. 04/27/19   Taam-Akelman, Rosalea K, MD  ibuprofen  (ADVIL ) 800 MG tablet Take 1 tablet (800 mg total) by mouth every 8 (eight) hours as needed. 02/21/20   Arlester Ladd D, CNM  levETIRAcetam  (KEPPRA ) 500 MG tablet Take 1 tablet (500 mg total) by mouth 2 (two) times daily. 12/20/22 01/19/23  Sallyanne Creamer, DO  oxyCODONE -acetaminophen  (PERCOCET) 5-325 MG tablet Take 1 tablet by mouth every 6 (six) hours as needed for severe pain. Patient not taking: Reported on 09/05/2019 04/27/19   Taam-Akelman, Rosalea K, MD  pantoprazole (PROTONIX) 20 MG tablet Take 20 mg by mouth daily.    [provider]  Prenatal Vit-Fe Fumarate-FA (MULTIVITAMIN-PRENATAL) 27-0.8 MG TABS tablet Take 1 tablet by mouth daily at 12 noon.    [provider]  sertraline (ZOLOFT) 50 MG tablet Take 50 mg by mouth daily. 05/28/19   [provider]  VENTOLIN  HFA 108 (90 Base) MCG/ACT inhaler Inhale 2 puffs into the lungs  every 6 (six) hours as needed for wheezing. 11/28/18   Dawson, Rolitta, CNM  XULANE 150-35 MCG/24HR transdermal patch Place 1 patch onto the skin once a week. 07/24/19   [provider]      Allergies    Metronidazole , Peanut oil, Pecan nut (diagnostic), and Penicillins    Review of Systems   Review of Systems  Constitutional:  Negative for fever.  Cardiovascular:  Negative for chest pain.  Gastrointestinal:  Positive for abdominal pain, nausea and vomiting.  Genitourinary:  Negative for flank pain.   Musculoskeletal:  Negative for back pain.    Physical Exam Updated Vital Signs BP (!) 129/92 (BP Location: Right Arm)   Pulse 82   Temp 98.3 F (36.8 C) (Oral)   Resp 16   Ht 5\' 6"  (1.676 m)   Wt 56.2 kg   LMP 08/08/2023   SpO2 100%   BMI 20.01 kg/m  Physical Exam Vitals and nursing note reviewed.  Constitutional:      General: She is not in acute distress.    Appearance: She is well-developed.  HENT:     Head: Normocephalic and atraumatic.     Mouth/Throat:     Pharynx: No oropharyngeal exudate.  Eyes:     Pupils: Pupils are equal, round, and reactive to light.  Cardiovascular:     Rate and Rhythm: Regular rhythm.     Heart sounds: Normal heart sounds.  Pulmonary:     Effort: Pulmonary effort is normal. No respiratory distress.     Breath sounds: Normal breath sounds.  Abdominal:     General: Bowel sounds are normal. There is no distension.     Palpations: Abdomen is soft.     Tenderness: There is no abdominal tenderness.  Musculoskeletal:        General: No tenderness or deformity.     Cervical back: Normal range of motion.     Right lower leg: No edema.     Left lower leg: No edema.  Skin:    General: Skin is warm and dry.  Neurological:     Mental Status: She is alert and oriented to person, place, and time.     ED Results / Procedures / Treatments   Labs (all labs ordered are listed, but only abnormal results are displayed) Labs Reviewed  WET PREP, GENITAL - Abnormal; Notable for the following components:      Result Value   Clue Cells Wet Prep HPF POC PRESENT (*)    All other components within normal limits  URINALYSIS, ROUTINE W REFLEX MICROSCOPIC - Abnormal; Notable for the following components:   APPearance HAZY (*)    Hgb urine dipstick SMALL (*)    All other components within normal limits  HCG, QUANTITATIVE, PREGNANCY  RAPID HIV SCREEN (HIV 1/2 AB+AG)  GC/CHLAMYDIA PROBE AMP ([place]) NOT AT [facility]    EKG None  Radiology No results  found.  Procedures Procedures    Medications Ordered in ED Medications - No data to display  ED Course/ Medical Decision Making/ A&P Clinical Course as of 09/14/23 1154  Thu Sep 14, 2023  1152 Clue Cells Wet Prep HPF POC(!): PRESENT Discussed with patient, asymptomatic [JS]    Clinical Course User Index [JS] Acelin Ferdig, PA-C                                 Medical Decision Making Amount and/or Complexity of Data Reviewed  Labs: ordered.     Patient present to the ED with a chief complaint of nausea, abdominal cramping, feeling overall food aversions like when she was pregnant the first time.  Vitals are within normal limits.  She denies any vaginal bleeding, her last menstrual cycle was earlier this month and finished on May 3, 25.  UA with some small hemoglobin noted, no nitrates or leukocytes to suggest infection, some suspicion of likely beginning her menstrual cycle.  Rapid HIV was negative.  Prep with clue cells present, she is asymptomatic at this time.  She is concerned for pregnancy in the beta quantitative was less than 1.  She did take 2 at home pregnancy test.  She does report this has been an ongoing problem as she is getting tested with her primary care physician for PCOS, along with endometritis.  No acute findings on today's visit.  We discussed appropriate follow-up with primary care physician.  She is hemodynamically stable for discharge.  Portions of this note were generated with Scientist, clinical (histocompatibility and immunogenetics). Dictation errors may occur despite best attempts at proofreading.   Final Clinical Impression(s) / ED Diagnoses Final diagnoses:  Nausea  Negative pregnancy test    Rx / DC Orders ED Discharge Orders     None         Delrico Minehart, PA-C 09/14/23 1154    Arvilla Birmingham, MD 09/14/23 [phone]

## 2023-09-15 LAB — GC/CHLAMYDIA PROBE AMP (~~LOC~~) NOT AT ARMC
Chlamydia: NEGATIVE
Comment: NEGATIVE
Comment: NORMAL
Neisseria Gonorrhea: NEGATIVE
